# Patient Record
Sex: Female | Born: 1945 | Race: Black or African American | Hispanic: No | Marital: Single | Smoking: Never smoker
Health system: Southern US, Community
[De-identification: ages and names within clinical notes are randomized; demographics above are authoritative.]

## PROBLEM LIST (undated history)

## (undated) DIAGNOSIS — I10 Essential (primary) hypertension: Secondary | ICD-10-CM

## (undated) DIAGNOSIS — K649 Unspecified hemorrhoids: Secondary | ICD-10-CM

## (undated) DIAGNOSIS — H269 Unspecified cataract: Secondary | ICD-10-CM

## (undated) DIAGNOSIS — E669 Obesity, unspecified: Secondary | ICD-10-CM

## (undated) HISTORY — PX: EYE SURGERY: SHX253

## (undated) HISTORY — PX: CATARACT EXTRACTION: SUR2

## (undated) HISTORY — PX: DILATATION AND CURETTAGE/HYSTEROSCOPY WITH MINERVA: SHX6851

## (undated) HISTORY — DX: Essential (primary) hypertension: I10

## (undated) HISTORY — DX: Unspecified hemorrhoids: K64.9

## (undated) HISTORY — DX: Obesity, unspecified: E66.9

## (undated) HISTORY — DX: Unspecified cataract: H26.9

## (undated) HISTORY — PX: TUBAL LIGATION: SHX77

## (undated) HISTORY — PX: BREAST SURGERY: SHX581

---

## 1989-05-05 HISTORY — PX: BREAST EXCISIONAL BIOPSY: SUR124

## 2000-05-14 ENCOUNTER — Encounter (INDEPENDENT_AMBULATORY_CARE_PROVIDER_SITE_OTHER): Payer: Self-pay | Admitting: Specialist

## 2000-05-14 ENCOUNTER — Ambulatory Visit (HOSPITAL_COMMUNITY): Admission: RE | Admit: 2000-05-14 | Discharge: 2000-05-14 | Payer: Self-pay | Admitting: Gastroenterology

## 2006-03-05 ENCOUNTER — Other Ambulatory Visit: Admission: RE | Admit: 2006-03-05 | Discharge: 2006-03-05 | Payer: Self-pay | Admitting: Obstetrics and Gynecology

## 2007-02-26 ENCOUNTER — Ambulatory Visit (HOSPITAL_COMMUNITY): Admission: RE | Admit: 2007-02-26 | Discharge: 2007-02-26 | Payer: Self-pay | Admitting: Surgery

## 2007-03-02 ENCOUNTER — Ambulatory Visit (HOSPITAL_COMMUNITY): Admission: RE | Admit: 2007-03-02 | Discharge: 2007-03-02 | Payer: Self-pay | Admitting: Surgery

## 2007-03-05 ENCOUNTER — Encounter: Admission: RE | Admit: 2007-03-05 | Discharge: 2007-03-05 | Payer: Self-pay | Admitting: Surgery

## 2007-03-11 ENCOUNTER — Encounter: Admission: RE | Admit: 2007-03-11 | Discharge: 2007-03-11 | Payer: Self-pay | Admitting: Surgery

## 2007-05-06 HISTORY — PX: LAPAROSCOPIC GASTRIC BANDING: SHX1100

## 2007-06-14 ENCOUNTER — Encounter: Admission: RE | Admit: 2007-06-14 | Discharge: 2007-09-12 | Payer: Self-pay | Admitting: Surgery

## 2007-06-28 ENCOUNTER — Ambulatory Visit (HOSPITAL_COMMUNITY): Admission: RE | Admit: 2007-06-28 | Discharge: 2007-06-29 | Payer: Self-pay | Admitting: Surgery

## 2010-09-17 NOTE — Op Note (Signed)
NAMECAYLYN, [name]              ACCOUNT NO.:  [id number]   MEDICAL RECORD NO.:  [id number]          PATIENT TYPE:  OIB   LOCATION:  1532                         FACILITY:  [facility]   PHYSICIAN:  Thornton Park. Daphine Deutscher, MD  DATE OF BIRTH:  01-19-1946   DATE OF PROCEDURE:  06/28/2007  DATE OF DISCHARGE:                               OPERATIVE REPORT   PREOPERATIVE DIAGNOSIS:  Morbid obesity, BMI over 46.   POSTOPERATIVE DIAGNOSIS:  Morbid obesity, BMI over 46.   PROCEDURE:  Laparoscopic adjustable gastric band with Allergan lap band  APS system and inspection for hiatal hernia - none seen.   SURGEON:  Thornton Park. Daphine Deutscher, MD   ASSISTANT:  Lorne Skeens. Hoxworth, M.D.   ANESTHESIA:  General endotracheal.   DESCRIPTION OF PROCEDURE:  Gabrielle [name] is a 65 year old African  American lady who desired lap band placement.  She had been to seminars  and instructed and informed consent was obtained regarding this  operation with the possibility of hiatal hernia repair, should that be  necessary.  She was taken to room 1 on June 28, 2007 and given  general anesthesia.  The abdomen was prepped with Techni-Care and draped  sterilely.  I entered the abdomen through the left upper quadrant using  OptiVu 0 degree scope and insufflated without difficulty.  Once  insufflated, we surveyed the abdomen and I placed standard trocars  including a 12 in the right lower position and a 15 in the right upper  position.  I then identified a place on the left sided of the stomach  for the band to come through and then from the lower port inserted the  band passer which came through.  However, prior to doing this we opened  APS kit and inserted the calibration tubing and blew up the balloon to  15 mL of air and pulled it back and it stopped at the EG junction giving  [place] a visible indicated that there was no evidence of a hiatal hernia.   After the band passer was in position, the band was introduced through  the 15 port threaded through the tip and brought around easily.  I then  snapped in place and with calibration tubing, everything appeared to be  in good position.  With it being held down by Dr. Johna Sheriff, I then  placed three free sutures.  I did encounter some hematoma on the second  stitch but with tying down the bleeding seemed to stop and no further  bleeding was noted.  The band appeared to be in good position.  We  brought out the tubing through the lower port and I expanded that  area and created a pocket which I numbed up with lidocaine prior to  suturing it down with four sutures of 2-0 Prolene.  This was sewn to the  fascia.  We irrigated very well and closed the wounds with 4-0 Vicryl  and Dermabond.  The patient tolerated procedure well was taken to  recovery room in satisfactory condition.      Thornton Park Daphine Deutscher, MD  Electronically Signed  MBM/MEDQ  D:  06/28/2007  T:  06/29/2007  Job:  [id number]   cc:   Merlene Laughter. Renae Gloss, M.D.  Fax: [phone]

## 2010-09-20 NOTE — Procedures (Signed)
[place]  Patient:    Gabrielle [name], Gabrielle [name]                       MRN: [id number] Proc. Date: 05/14/00 Adm. Date:  [id number] Attending:  Charna Elizabeth CC:         Merlene Laughter. Renae Gloss, M.D.   Procedure Report  DATE OF BIRTH:  1946/03/31  REFERRING PHYSICIAN:  Merlene Laughter. Renae Gloss, M.D.  PROCEDURE PERFORMED:  Colonoscopy with biopsies.  ENDOSCOPIST:  Anselmo Rod, M.D.  INSTRUMENT USED:  Olympus video colonoscope.  INDICATIONS FOR PROCEDURE:  Family history of colon cancer in a 65 year old African-American female.  Rule out colonic polyps masses, hemorrhoids, etc.  PREPROCEDURE PREPARATION:  Informed consent was procured from the patient. The patient was fasted for eight hours prior to the procedure and prepped with a bottle of magnesium citrate and a gallon of NuLytely the night prior to the procedure.  PREPROCEDURE PHYSICAL:  The patient had stable vital signs.  Neck supple. Chest clear to auscultation.  S1, S2 regular.  Abdomen soft with normal abdominal bowel sounds.  DESCRIPTION OF PROCEDURE:  The patient was placed in the left lateral decubitus position and sedated with 40 mg of Demerol and 4 mg of Versed intravenously.  Once the patient was adequately sedated and maintained on low-flow oxygen and continuous cardiac monitoring, the Olympus video colonoscope was advanced from the rectum to the cecum and terminal ileum without difficulty.  A small flat polypoid lesion was seen in the right colon just distal to the cecum.  This seemed to be lipomatous in nature.  This was biopsied for pathology.  No other abnormalities were noticed. N oclock masses, polyps, erosions, ulcerations or diverticula were present.  There was no evidence of hemorrhoids.  The patient tolerated the procedure well without complications.  IMPRESSION:  Small flat polypoid lesion biopsied from right colon just distal to the cecum.  Question  lipoma.  RECOMMENDATIONS: 1. Await pathology results. 2. Repear colorectal screening in the next five years or earlier if need be. 3. Outpatient follow-up on a p.r.n. basis.DD:  05/14/00 TD:  05/14/00 Job: [id number]

## 2011-01-24 LAB — DIFFERENTIAL
Basophils Absolute: 0
Basophils Relative: 0
Eosinophils Absolute: 0
Eosinophils Relative: 0
Lymphocytes Relative: 10 — ABNORMAL LOW
Lymphs Abs: 1.1
Monocytes Absolute: 0.5
Monocytes Relative: 5
Neutro Abs: 9.2 — ABNORMAL HIGH
Neutrophils Relative %: 85 — ABNORMAL HIGH

## 2011-01-24 LAB — CBC
HCT: 33.1 — ABNORMAL LOW
Hemoglobin: 11 — ABNORMAL LOW
MCHC: 33.3
MCV: 87.7
Platelets: 283
RBC: 3.78 — ABNORMAL LOW
RDW: 15.8 — ABNORMAL HIGH
WBC: 10.9 — ABNORMAL HIGH

## 2011-01-24 LAB — HEMOGLOBIN AND HEMATOCRIT, BLOOD
HCT: 36.9
Hemoglobin: 12.3

## 2011-01-24 LAB — BASIC METABOLIC PANEL
BUN: 17
CO2: 31
Calcium: 9.6
Chloride: 99
Creatinine, Ser: 1
GFR calc Af Amer: >60
GFR calc non Af Amer: 57 — ABNORMAL LOW
Glucose, Bld: 94
Potassium: 4.6
Sodium: 138

## 2011-01-24 LAB — PREGNANCY, URINE: Preg Test, Ur: NEGATIVE

## 2012-11-04 ENCOUNTER — Encounter (INDEPENDENT_AMBULATORY_CARE_PROVIDER_SITE_OTHER): Payer: Self-pay

## 2012-11-08 ENCOUNTER — Encounter (INDEPENDENT_AMBULATORY_CARE_PROVIDER_SITE_OTHER): Payer: Self-pay

## 2012-11-08 ENCOUNTER — Ambulatory Visit (INDEPENDENT_AMBULATORY_CARE_PROVIDER_SITE_OTHER): Payer: BC Managed Care – PPO | Admitting: General Surgery

## 2012-11-08 ENCOUNTER — Encounter (INDEPENDENT_AMBULATORY_CARE_PROVIDER_SITE_OTHER): Payer: Self-pay | Admitting: General Surgery

## 2012-11-08 VITALS — BP 142/84 | HR 60 | Temp 97.3°F | Resp 14 | Ht 66.0 in | Wt 215.8 lb

## 2012-11-08 DIAGNOSIS — K6289 Other specified diseases of anus and rectum: Secondary | ICD-10-CM

## 2012-11-08 NOTE — Patient Instructions (Signed)
Inspect the area monthly as we discussed. If anything changes please call the office and make an appointment to be seen again.

## 2012-11-08 NOTE — Progress Notes (Signed)
Patient ID: Gabrielle [name], female   DOB: 1945-06-12, 67 y.o.   MRN: [id number]  Chief Complaint  Patient presents with  . New Evaluation    eval rectal lesion    HPI Gabrielle [name] is a 67 y.o. female.   HPI  She is referred by Dr. Loreta Ave for further evaluation and treatment of a small anal lesion in the anal verge area. Recently, she had some discomfort and little bleeding in that area. She saw her primary care physician (Dr. Renae Gloss) who then referred her to Dr. Loreta Ave. Colonoscopy was performed and benign adenomas were removed. She states that she's been doing fairly well since the colonoscopy. She denies any pain. She does have some intermittent constipation.  Past Medical History  Diagnosis Date  . Obesity   . Hypertension   . Hemorrhoids     Past Surgical History  Procedure Laterality Date  . Laparoscopic gastric banding  2009  . Tubal ligation    . Breast surgery      Family History  Problem Relation Age of Onset  . COPD Mother     colon  . Heart disease Father   . Heart disease Brother     Social History History  Substance Use Topics  . Smoking status: Never Smoker   . Smokeless tobacco: Never Used  . Alcohol Use: No    No Known Allergies  Current Outpatient Prescriptions  Medication Sig Dispense Refill  . aspirin 81 MG tablet Take 81 mg by mouth daily.      Marland Kitchen lisinopril-hydrochlorothiazide (PRINZIDE,ZESTORETIC) 20-12.5 MG per tablet       . Multiple Vitamins-Minerals (MULTIVITAMIN PO) Take by mouth.      Marland Kitchen azithromycin (ZITHROMAX) 250 MG tablet        No current facility-administered medications for this visit.    Review of Systems Review of Systems  Constitutional:       She has lost 100 pounds since her lap band procedure in 2009.  Gastrointestinal: Positive for constipation and anal bleeding. Negative for abdominal pain.    Blood pressure 142/84, pulse 60, temperature 97.3 F (36.3 C), temperature source Temporal, resp. rate 14, height 5\' 6"  (1.676  m), weight 215 lb 12.8 oz (97.886 kg).  Physical Exam Physical Exam  Constitutional:  Obese female in no acute distress.  Genitourinary:  In the right posterior area as a external hemorrhoid. There is a small smooth nodular area that is palpable consistent with a hypertrophied anal papilla grossly. No further lesions to suggest malignancy. It is nontender.  Anoscopy confirms that there are no other anal masses and only this small smooth nodular area near the anal verge.    Data Reviewed Notes from Dr. Loreta Ave.  Assessment    I think the anal lesion is most consistent with a hypertrophied anal papilla. We discussed options including removing it surgically versus observing it. I told her how to inspect herself. Given those options, she has chosen observation for now.     Plan    If the lesion becomes more bothersome or changes, I have told her it would need to be removed and have instructed her to call the office and get an appointment at that time.        Gabrielle [name] J 11/08/2012, 2:10 PM

## 2012-11-11 ENCOUNTER — Encounter (INDEPENDENT_AMBULATORY_CARE_PROVIDER_SITE_OTHER): Payer: Self-pay

## 2012-12-08 ENCOUNTER — Other Ambulatory Visit: Payer: Self-pay

## 2013-03-10 ENCOUNTER — Other Ambulatory Visit: Payer: Self-pay

## 2016-03-04 ENCOUNTER — Emergency Department (HOSPITAL_BASED_OUTPATIENT_CLINIC_OR_DEPARTMENT_OTHER): Admit: 2016-03-04 | Discharge: 2016-03-04 | Disposition: A | Discharge status: Home / Self Care | Payer: Medicare Other

## 2016-03-04 ENCOUNTER — Emergency Department (HOSPITAL_COMMUNITY)
Admission: EM | Admit: 2016-03-04 | Discharge: 2016-03-04 | Disposition: A | Discharge status: Home / Self Care | Payer: Medicare Other | Attending: Emergency Medicine | Admitting: Emergency Medicine

## 2016-03-04 ENCOUNTER — Encounter (HOSPITAL_COMMUNITY): Payer: Self-pay | Admitting: *Deleted

## 2016-03-04 ENCOUNTER — Encounter (HOSPITAL_COMMUNITY): Payer: Self-pay | Admitting: Emergency Medicine

## 2016-03-04 ENCOUNTER — Ambulatory Visit (HOSPITAL_COMMUNITY)
Admission: EM | Admit: 2016-03-04 | Discharge: 2016-03-04 | Disposition: A | Discharge status: Nursing Home (Includes ICF) | Payer: Medicare Other | Attending: Family Medicine | Admitting: Family Medicine

## 2016-03-04 DIAGNOSIS — M79609 Pain in unspecified limb: Secondary | ICD-10-CM | POA: Diagnosis not present

## 2016-03-04 DIAGNOSIS — R42 Dizziness and giddiness: Secondary | ICD-10-CM | POA: Diagnosis not present

## 2016-03-04 DIAGNOSIS — I10 Essential (primary) hypertension: Secondary | ICD-10-CM | POA: Insufficient documentation

## 2016-03-04 DIAGNOSIS — M79605 Pain in left leg: Secondary | ICD-10-CM

## 2016-03-04 DIAGNOSIS — Z7982 Long term (current) use of aspirin: Secondary | ICD-10-CM | POA: Insufficient documentation

## 2016-03-04 DIAGNOSIS — R202 Paresthesia of skin: Secondary | ICD-10-CM | POA: Insufficient documentation

## 2016-03-04 MED ORDER — NAPROXEN 375 MG PO TABS: 375.0000 mg | ORAL_TABLET | Freq: Two times a day (BID) | ORAL | 0 refills | Status: DC

## 2016-03-04 NOTE — ED Notes (Signed)
Pt transported to venous study.

## 2016-03-04 NOTE — ED Provider Notes (Signed)
[facility]    CSN: [id number] Arrival date & time: 03/04/16  1032     History   Chief Complaint Chief Complaint  Patient presents with  . Leg Pain    HPI Gabrielle [name] is a 70 y.o. female.   The history is provided by the patient.  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   Past Medical History:  Diagnosis Date  . Hemorrhoids   . Hypertension   . Obesity     Patient Active Problem List   Diagnosis Date Noted  . 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 BREAST SURGERY    . LAPAROSCOPIC GASTRIC BANDING  2009  . TUBAL LIGATION      OB History    No data available       Home Medications    Prior to Admission medications   Medication Sig Start Date End Date Taking? Authorizing Provider  Vitamin D, Ergocalciferol, (DRISDOL) 50000 units CAPS capsule Take 50,000 Units by mouth every 7 (seven) days.   Yes Historical Provider, MD  aspirin 81 MG tablet Take 81 mg by mouth daily.    Historical Provider, MD  azithromycin (ZITHROMAX) 250 MG tablet  09/28/12   Historical Provider, MD  lisinopril-hydrochlorothiazide Marcell Anger(PRINZIDE,ZESTORETIC) 20-12.5 MG per tablet  10/08/12   Historical Provider, MD  Multiple Vitamins-Minerals (MULTIVITAMIN PO) Take by mouth.    Historical Provider, MD    Family History Family History  Problem Relation Age of Onset  . COPD Mother     colon  . Heart disease Father   . Heart disease Brother     Social History Social History  Substance Use Topics  . Smoking status: Never Smoker  . Smokeless tobacco: Never  Used  . Alcohol use No     Allergies   Review of patient's allergies indicates no known allergies.   Review of Systems Review of Systems  Constitutional: Negative for fever.  HENT: Negative.   Respiratory: Negative.  Negative for chest tightness and shortness of breath.   Cardiovascular: Negative for chest pain, palpitations and leg swelling.  Musculoskeletal: Negative for back pain.  All other systems reviewed and are negative.    Physical Exam Triage Vital Signs ED Triage Vitals [03/04/16 1056]  Enc Vitals Group     BP 134/78     Pulse Rate 78     Resp      Temp 98.6 F (37 C)     Temp src      SpO2 99 %     Weight      Height      Head Circumference      Peak Flow      Pain Score      Pain Loc      Pain Edu?      Excl. in GC?    No data found.   Updated Vital Signs BP 134/78 (BP Location: Left Arm)   Pulse 78   Temp 98.6 F (37 C)  SpO2 99%   Visual Acuity Right Eye Distance:   Left Eye Distance:   Bilateral Distance:    Right Eye Near:   Left Eye Near:    Bilateral Near:     Physical Exam  Constitutional: She is oriented to person, place, and time. She appears well-developed and well-nourished. No distress.  Abdominal: Soft. Bowel sounds are normal.  Musculoskeletal: She exhibits no edema, tenderness or deformity.  Neurological: She is alert and oriented to person, place, and time.  Skin: Skin is warm and dry.  Nursing note and vitals reviewed.    UC Treatments / Results  Labs (all labs ordered are listed, but only abnormal results are displayed) Labs Reviewed - No data to display  EKG  EKG Interpretation None       Radiology No results found.  Procedures Procedures (including critical care time)  Medications Ordered in UC Medications - No data to display   Initial Impression / Assessment and Plan / UC Course  I have reviewed the triage vital signs and the nursing notes.  Pertinent labs & imaging results that were  available during my care of the patient were reviewed by me and considered in my medical decision making (see chart for details).  Clinical Course    Sent to r/o dvt left leg.  Risk factor prolonged driving episodes to [place]  Final Clinical Impressions(s) / UC Diagnoses   Final diagnoses:  None    New Prescriptions New Prescriptions   No medications on file     Linna HoffJames D Mirella Gueye, MD 03/04/16 1122

## 2016-03-04 NOTE — ED Notes (Signed)
Pt  To  Be  Transferred  To    Er  To  R/o    dvt

## 2016-03-04 NOTE — ED Provider Notes (Signed)
DVT study is negative. Patient discharged home.   Gwyneth SproutWhitney Jamieson Lisa, MD 03/04/16 1620

## 2016-03-04 NOTE — Progress Notes (Signed)
*  PRELIMINARY RESULTS* Vascular Ultrasound Left lower extremity duplex has been completed.  Preliminary findings: No evidence of deep vein thrombosis or baker's cysts in the left lower extremity.   Chauncey FischerCharlotte C Nollie Shiflett 03/04/2016, 3:41 PM

## 2016-03-04 NOTE — ED Provider Notes (Signed)
[facility] Note   CSN: [id number] Arrival date & time: 03/04/16  1124     History   Chief Complaint Chief Complaint  Patient presents with  . Foot Pain    HPI Pola CornWilletta Bottino is a 70 y.o. female.  HPI patient sent from urgent care to ED to rule out DVT. [place] in last couple of days. She reports yesterday she developed numbness and tingling on the outside of her left foot as well as some tightness in her left hamstring. She reports taking Motrin which resolved her discomfort yesterday, but did not repeat that therapy today. She denies any fevers, chills, cough, shortness of breath, unilateral leg swelling or other medical complaints  Past Medical History:  Diagnosis Date  . Hemorrhoids   . Hypertension   . Obesity     Patient Active Problem List   Diagnosis Date Noted  . 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 BREAST SURGERY    . LAPAROSCOPIC GASTRIC BANDING  2009  . TUBAL LIGATION      OB History    No data available       Home Medications    Prior to Admission medications   Medication Sig Start Date End Date Taking? Authorizing Provider  aspirin 81 MG tablet Take 81 mg by mouth daily.   Yes Historical Provider, MD  lisinopril-hydrochlorothiazide (PRINZIDE,ZESTORETIC) 20-12.5 MG per tablet Take 1 tablet by mouth daily.  10/08/12  Yes Historical Provider, MD  Multiple Vitamins-Minerals (MULTIVITAMIN PO) Take 1 tablet by mouth daily.    Yes Historical Provider, MD  Vitamin D, Ergocalciferol, (DRISDOL) 50000 units CAPS capsule Take 50,000 Units by mouth 2 (two) times daily.    Yes Historical Provider, MD    Family History Family History  Problem Relation Age of Onset  . COPD Mother     colon  . Heart disease Father   . Heart disease Brother     Social History Social History  Substance Use Topics  . Smoking status: Never Smoker  . Smokeless  tobacco: Never Used  . Alcohol use No     Allergies   Review of patient's allergies indicates no known allergies.   Review of Systems Review of Systems A 10 point review of systems was completed and was negative except for pertinent positives and negatives as mentioned in the history of present illness    Physical Exam Updated Vital Signs BP 141/75 (BP Location: Left Arm)   Pulse (!) 57   Temp 97.6 F (36.4 C) (Oral)   Resp 14   SpO2 97%   Physical Exam  Constitutional: She appears well-developed. No distress.  Awake, alert and nontoxic in appearance  HENT:  Head: Normocephalic and atraumatic.  Right Ear: External ear normal.  Left Ear: External ear normal.  Mouth/Throat: Oropharynx is clear and moist.  Eyes: Conjunctivae and EOM are normal. Pupils are equal, round, and reactive to light.  Neck: Normal range of motion. No JVD present.  Cardiovascular: Normal rate, regular rhythm and normal heart sounds.   Pulmonary/Chest: Effort normal and breath sounds normal. No stridor.  Abdominal: Soft. There is no tenderness.  Musculoskeletal: Normal range of motion.  No unilateral leg swelling. Left leg is soft with no tenderness along the venous system. Negative Homans. Moves all large joints without difficulty. Distal pulses intact with brisk cap refill. Sensation is intact to light touch along the lateral aspect and throughout  all digits. Gait is baseline without ataxia. No skin abnormalities.  Neurological:  Awake, alert, cooperative and aware of situation; motor strength bilaterally; sensation normal to light touch bilaterally; no facial asymmetry; tongue midline; major cranial nerves appear intact;  baseline gait without new ataxia.  Skin: No rash noted. She is not diaphoretic.  Psychiatric: She has a normal mood and affect. Her behavior is normal. Thought content normal.  Nursing note and vitals reviewed.    ED Treatments / Results  Labs (all labs ordered are listed, but  only abnormal results are displayed) Labs Reviewed - No data to display  EKG  EKG Interpretation None       Radiology No results found.  Procedures Procedures (including critical care time)  Medications Ordered in ED Medications - No data to display   Initial Impression / Assessment and Plan / ED Course  I have reviewed the triage vital signs and the nursing notes.  Pertinent labs & imaging results that were available during my care of the patient were reviewed by me and considered in my medical decision making (see chart for details).  Clinical Course    Patient sent from urgent care to rule out DVT. Pending lower extremity ultrasound. Exam reassuring, hemodynamically stable. Pt care signed out to Dr. Anitra LauthPlunkett. If US negative anticipate discharge with continued Motrin therapy and follow-up with PCP. Return precautions discussed  Final Clinical Impressions(s) / ED Diagnoses   Final diagnoses:  Paresthesia of left foot    New Prescriptions New Prescriptions   No medications on file     Joycie PeekBenjamin Kolter Reaver, PA-C 03/05/16 1144    Pricilla LovelessScott Goldston, MD 03/12/16 [phone]

## 2016-03-04 NOTE — Discharge Instructions (Addendum)
There does not appear to be an emergent cause for your symptoms at this time. Your exam, ultrasound were very reassuring. Please follow-up with your doctor next week. Continue taking Motrin as we discussed. Return to ED for new or worsening symptoms as we discussed.

## 2016-03-04 NOTE — ED Triage Notes (Signed)
Pt states "yesterday morning I had numbness in my left toes, thought it was maybe a cramp, and now my whole foot is numb and cold, used a heating pad on it and it did help some, and I feel like theres some stretching in the back of my left thigh." Pt sensation is intact, strength equal. Pt ambulatory, no facial droop, no chest pain, stroke screen negative.

## 2016-03-04 NOTE — ED Triage Notes (Addendum)
Pain  And  Numbness  l      l  Leg  With  Tightness         denys   Any   Recent     Injury  No  Recent  Air  Travel          denys  Any  Chest  Pain or  Shortness  Of  Breath  Pt  Reports  Some   Cramping  l   Upper  Thigh  As   Well

## 2017-01-14 ENCOUNTER — Other Ambulatory Visit: Payer: Self-pay | Admitting: Internal Medicine

## 2017-01-14 DIAGNOSIS — N63 Unspecified lump in unspecified breast: Secondary | ICD-10-CM

## 2017-01-21 ENCOUNTER — Ambulatory Visit
Admission: RE | Admit: 2017-01-21 | Discharge: 2017-01-21 | Disposition: A | Discharge status: Home / Self Care | Payer: Medicare Other | Source: Ambulatory Visit | Attending: Internal Medicine | Admitting: Internal Medicine

## 2017-01-21 ENCOUNTER — Ambulatory Visit: Payer: Medicare Other

## 2017-01-21 DIAGNOSIS — N63 Unspecified lump in unspecified breast: Secondary | ICD-10-CM

## 2017-03-25 ENCOUNTER — Other Ambulatory Visit: Payer: Self-pay | Admitting: Internal Medicine

## 2017-03-25 DIAGNOSIS — E2839 Other primary ovarian failure: Secondary | ICD-10-CM

## 2018-02-16 ENCOUNTER — Telehealth: Payer: Self-pay | Admitting: General Practice

## 2018-02-16 NOTE — Telephone Encounter (Signed)
I left a message letting the patient know that her yearly Medicare questionnaire has been scheduled on 02/3018 with [place]ke[place]. VDM (DD)

## 2018-03-03 ENCOUNTER — Ambulatory Visit: Payer: Medicare Other

## 2018-03-03 DIAGNOSIS — Z23 Encounter for immunization: Secondary | ICD-10-CM

## 2018-03-03 NOTE — Progress Notes (Signed)
Subjective:   Ophia Lechuga is a 72 y.o. female who presents for Medicare Annual (Subsequent) preventive examination.  Review of Systems:  n/a Cardiac Risk Factors include: advanced age (>110men, >71 women);hypertension;obesity (BMI >30kg/m2)     Objective:     Vitals: BP 130/72 (BP Location: Left Arm)   Pulse (!) 56   Temp 97.9 F (36.6 C)   Ht 5\' 5"  (1.651 m)   Wt 230 lb 3.2 oz (104.4 kg)   BMI 38.31 kg/m   Body mass index is 38.31 kg/m.  Advanced Directives 03/03/2018  Does Patient Have a Medical Advance Directive? Yes  Type of Advance Directive Living will  Does patient want to make changes to medical advance directive? No - Patient declined    Tobacco Social History   Tobacco Use  Smoking Status Never Smoker  Smokeless Tobacco Never Used     Counseling given: Not Answered   Clinical Intake:  Pre-visit preparation completed: Yes  Pain : No/denies pain Pain Score: 0-No pain     Nutritional Status: BMI > 30  Obese Nutritional Risks: None Diabetes: No  How often do you need to have someone help you when you read instructions, pamphlets, or other written materials from your doctor or pharmacy?: 1 - Never What is the last grade level you completed in school?: SOME COLLEGE  Interpreter Needed?: No  Information entered by :: NAllen LPN  Past Medical History:  Diagnosis Date  . Hemorrhoids   . Hypertension   . Obesity    Past Surgical History:  Procedure Laterality Date  . BREAST EXCISIONAL BIOPSY Left 1991  . BREAST SURGERY    . LAPAROSCOPIC GASTRIC BANDING  2009  . TUBAL LIGATION     Family History  Problem Relation Age of Onset  . COPD Mother        colon  . Heart disease Father   . Heart disease Brother    Social History   Socioeconomic History  . Marital status: Single    Spouse name: Not on file  . Number of children: Not on file  . Years of education: Not on file  . Highest education level: Not on file  Occupational History    . Occupation: retired  Engineer, production  . Financial resource strain: Not hard at all  . Food insecurity:    Worry: Never true    Inability: Never true  . Transportation needs:    Medical: No    Non-medical: No  Tobacco Use  . Smoking status: Never Smoker  . Smokeless tobacco: Never Used  Substance and Sexual Activity  . Alcohol use: No  . Drug use: No  . Sexual activity: Not Currently  Lifestyle  . Physical activity:    Days per week: 4 days    Minutes per session: 60 min  . Stress: Not at all  Relationships  . Social connections:    Talks on phone: Not on file    Gets together: Not on file    Attends religious service: Not on file    Active member of club or organization: Not on file    Attends meetings of clubs or organizations: Not on file    Relationship status: Not on file  Other Topics Concern  . Not on file  Social History Narrative  . Not on file    Outpatient Encounter Medications as of 03/03/2018  Medication Sig  . aspirin 81 MG tablet Take 81 mg by mouth daily.  Marland Kitchen lisinopril-hydrochlorothiazide (PRINZIDE,ZESTORETIC) 20-12.5  MG per tablet Take 1 tablet by mouth daily.   . Multiple Vitamins-Minerals (MULTIVITAMIN PO) Take 1 tablet by mouth daily.   . Vitamin D, Ergocalciferol, (DRISDOL) 50000 units CAPS capsule Take 50,000 Units by mouth 2 (two) times daily.   . naproxen (NAPROSYN) 375 MG tablet Take 1 tablet (375 mg total) by mouth 2 (two) times daily.   No facility-administered encounter medications on file as of 03/03/2018.     Activities of Daily Living In your present state of health, do you have any difficulty performing the following activities: 03/03/2018  Hearing? N  Vision? Y  Comment sight has changed, but has annual exams. Small cataract being watched  Difficulty concentrating or making decisions? N  Walking or climbing stairs? N  Dressing or bathing? N  Doing errands, shopping? N  Preparing Food and eating ? N  Using the Toilet? N  In the  past six months, have you accidently leaked urine? N  Do you have problems with loss of bowel control? N  Managing your Medications? N  Managing your Finances? N  Housekeeping or managing your Housekeeping? N  Some recent data might be hidden    Patient Care Team: Dorothyann Peng, MD as PCP - General (Internal Medicine) Sallye Lat, MD as Consulting Physician (Ophthalmology)    Assessment:   This is a routine wellness examination for [place].  Exercise Activities and Dietary recommendations Current Exercise Habits: Structured exercise class, Type of exercise: strength training/weights;calisthenics, Time (Minutes): 60, Frequency (Times/Week): 4, Weekly Exercise (Minutes/Week): 240, Intensity: Moderate, Exercise limited by: None identified  Goals    . Weight (lb) < 200 lb (90.7 kg) (pt-stated)     Would like to lose 8 pounds gained over last year       Fall Risk Fall Risk  03/03/2018  Falls in the past year? No  Risk for fall due to : Medication side effect   Is the patient's home free of loose throw rugs in walkways, pet beds, electrical cords, etc?   yes      Grab bars in the bathroom? no      Handrails on the stairs?   yes      Adequate lighting?   yes  Timed Get Up and Go performed: n/a  Depression Screen PHQ 2/9 Scores 03/03/2018  PHQ - 2 Score 0     Cognitive Function     6CIT Screen 03/03/2018  What Year? 0 points  What month? 0 points  What time? 0 points  Count back from 20 0 points  Months in reverse 0 points  Repeat phrase 0 points  Total Score 0    Immunization History  Administered Date(s) Administered  . Influenza, High Dose Seasonal PF 03/03/2018    Qualifies for Shingles Vaccine?yes  Screening Tests Health Maintenance  Topic Date Due  . Hepatitis C Screening  [birth year]  . PNA vac Low Risk Adult (1 of 2 - PCV13) 09/17/2012  . MAMMOGRAM  01/22/2019  . COLONOSCOPY  11/02/2022  . TETANUS/TDAP  01/11/2024  . INFLUENZA VACCINE   Completed  . DEXA SCAN  Completed    Cancer Screenings: Lung: Low Dose CT Chest recommended if Age 56-80 years, 30 pack-year currently smoking OR have quit w/in 15years. Patient does not qualify. Breast:  Up to date on Mammogram? No   Up to date of Bone Density/Dexa? No Colorectal: up to date  Additional Screenings: : Hepatitis C Screening: n/a     Plan:    Patient wants to  lose 8 pounds that she gained in the past year. States that she has a mammogram appt made and that she will look in to BMD at that visit.  I have personally reviewed and noted the following in the patient's chart:  . Medical and social history . Use of alcohol, tobacco or illicit drugs  . Current medications and supplements . Functional ability and status . Nutritional status . Physical activity . Advanced directives . List of other physicians . Hospitalizations, surgeries, and ER visits in previous 12 months . Vitals . Screenings to include cognitive, depression, and falls . Referrals and appointments  In addition, I have reviewed and discussed with patient certain preventive protocols, quality metrics, and best practice recommendations. A written personalized care plan for preventive services as well as general preventive health recommendations were provided to patient.     Barb Merino, LPN  [id number]/21/[id number]

## 2018-03-03 NOTE — Patient Instructions (Signed)
Gabrielle [name] , Thank you for taking time to come for your Medicare Wellness Visit. I appreciate your ongoing commitment to your health goals. Please review the following plan we discussed and let me know if I can assist you in the future.   Screening recommendations/referrals: Colonoscopy: 10/2012 Mammogram: appt. set Bone Density: pt. Will look in to Recommended yearly ophthalmology/optometry visit for glaucoma screening and checkup Recommended yearly dental visit for hygiene and checkup  Vaccinations: Influenza vaccine: today Pneumococcal vaccine: prevnar 13 order sent to pharmacy Tdap vaccine: 01/2014 Shingles vaccine:   Advanced directives: Please bring a copy of your POA (Power of [place]) and/or Living Will to your next appointment.    Conditions/risks identified: Obesity: Patient wants to lose 8 pounds gained over last year  Next appointment: 04/14/2018 at 9:15a   Preventive Care 70 Years and Older, Female Preventive care refers to lifestyle choices and visits with your health care provider that can promote health and wellness. What does preventive care include?  A yearly physical exam. This is also called an annual well check.  Dental exams once or twice a year.  Routine eye exams. Ask your health care provider how often you should have your eyes checked.  Personal lifestyle choices, including:  Daily care of your teeth and gums.  Regular physical activity.  Eating a healthy diet.  Avoiding tobacco and drug use.  Limiting alcohol use.  Practicing safe sex.  Taking low-dose aspirin every day.  Taking vitamin and mineral supplements as recommended by your health care provider. What happens during an annual well check? The services and screenings done by your health care provider during your annual well check will depend on your age, overall health, lifestyle risk factors, and family history of disease. Counseling  Your health care provider may ask you questions  about your:  Alcohol use.  Tobacco use.  Drug use.  Emotional well-being.  Home and relationship well-being.  Sexual activity.  Eating habits.  History of falls.  Memory and ability to understand (cognition).  Work and work Astronomer.  Reproductive health. Screening  You may have the following tests or measurements:  Height, weight, and BMI.  Blood pressure.  Lipid and cholesterol levels. These may be checked every 5 years, or more frequently if you are over 70 years old.  Skin check.  Lung cancer screening. You may have this screening every year starting at age 70 if you have a 30-pack-year history of smoking and currently smoke or have quit within the past 15 years.  Fecal occult blood test (FOBT) of the stool. You may have this test every year starting at age 70.  Flexible sigmoidoscopy or colonoscopy. You may have a sigmoidoscopy every 5 years or a colonoscopy every 10 years starting at age 70.  Hepatitis C blood test.  Hepatitis B blood test.  Sexually transmitted disease (STD) testing.  Diabetes screening. This is done by checking your blood sugar (glucose) after you have not eaten for a while (fasting). You may have this done every 1-3 years.  Bone density scan. This is done to screen for osteoporosis. You may have this done starting at age 70.  Mammogram. This may be done every 1-2 years. Talk to your health care provider about how often you should have regular mammograms. Talk with your health care provider about your test results, treatment options, and if necessary, the need for more tests. Vaccines  Your health care provider may recommend certain vaccines, such as:  Influenza vaccine. This is  recommended every year.  Tetanus, diphtheria, and acellular pertussis (Tdap, Td) vaccine. You may need a Td booster every 10 years.  Zoster vaccine. You may need this after age 70.  Pneumococcal 13-valent conjugate (PCV13) vaccine. One dose is  recommended after age 70.  Pneumococcal polysaccharide (PPSV23) vaccine. One dose is recommended after age 70. Talk to your health care provider about which screenings and vaccines you need and how often you need them. This information is not intended to replace advice given to you by your health care provider. Make sure you discuss any questions you have with your health care provider. Document Released: 05/18/2015 Document Revised: 01/09/2016 Document Reviewed: 02/20/2015 Elsevier Interactive Patient Education  2017 [place] Prevention in the Home Falls can cause injuries. They can happen to people of all ages. There are many things you can do to make your home safe and to help prevent falls. What can I do on the outside of my home?  Regularly fix the edges of walkways and driveways and fix any cracks.  Remove anything that might make you trip as you walk through a door, such as a raised step or threshold.  Trim any bushes or trees on the path to your home.  Use bright outdoor lighting.  Clear any walking paths of anything that might make someone trip, such as rocks or tools.  Regularly check to see if handrails are loose or broken. Make sure that both sides of any steps have handrails.  Any raised decks and porches should have guardrails on the edges.  Have any leaves, snow, or ice cleared regularly.  Use sand or salt on walking paths during winter.  Clean up any spills in your garage right away. This includes oil or grease spills. What can I do in the bathroom?  Use night lights.  Install grab bars by the toilet and in the tub and shower. Do not use towel bars as grab bars.  Use non-skid mats or decals in the tub or shower.  If you need to sit down in the shower, use a plastic, non-slip stool.  Keep the floor dry. Clean up any water that spills on the floor as soon as it happens.  Remove soap buildup in the tub or shower regularly.  Attach bath mats  securely with double-sided non-slip rug tape.  Do not have throw rugs and other things on the floor that can make you trip. What can I do in the bedroom?  Use night lights.  Make sure that you have a light by your bed that is easy to reach.  Do not use any sheets or blankets that are too big for your bed. They should not hang down onto the floor.  Have a firm chair that has side arms. You can use this for support while you get dressed.  Do not have throw rugs and other things on the floor that can make you trip. What can I do in the kitchen?  Clean up any spills right away.  Avoid walking on wet floors.  Keep items that you use a lot in easy-to-reach places.  If you need to reach something above you, use a strong step stool that has a grab bar.  Keep electrical cords out of the way.  Do not use floor polish or wax that makes floors slippery. If you must use wax, use non-skid floor wax.  Do not have throw rugs and other things on the floor that can make you trip.  What can I do with my stairs?  Do not leave any items on the stairs.  Make sure that there are handrails on both sides of the stairs and use them. Fix handrails that are broken or loose. Make sure that handrails are as long as the stairways.  Check any carpeting to make sure that it is firmly attached to the stairs. Fix any carpet that is loose or worn.  Avoid having throw rugs at the top or bottom of the stairs. If you do have throw rugs, attach them to the floor with carpet tape.  Make sure that you have a light switch at the top of the stairs and the bottom of the stairs. If you do not have them, ask someone to add them for you. What else can I do to help prevent falls?  Wear shoes that:  Do not have high heels.  Have rubber bottoms.  Are comfortable and fit you well.  Are closed at the toe. Do not wear sandals.  If you use a stepladder:  Make sure that it is fully opened. Do not climb a closed  stepladder.  Make sure that both sides of the stepladder are locked into place.  Ask someone to hold it for you, if possible.  Clearly mark and make sure that you can see:  Any grab bars or handrails.  First and last steps.  Where the edge of each step is.  Use tools that help you move around (mobility aids) if they are needed. These include:  Canes.  Walkers.  Scooters.  Crutches.  Turn on the lights when you go into a dark area. Replace any light bulbs as soon as they burn out.  Set up your furniture so you have a clear path. Avoid moving your furniture around.  If any of your floors are uneven, fix them.  If there are any pets around you, be aware of where they are.  Review your medicines with your doctor. Some medicines can make you feel dizzy. This can increase your chance of falling. Ask your doctor what other things that you can do to help prevent falls. This information is not intended to replace advice given to you by your health care provider. Make sure you discuss any questions you have with your health care provider. Document Released: 02/15/2009 Document Revised: 09/27/2015 Document Reviewed: 05/26/2014 Elsevier Interactive Patient Education  2017 [employer].

## 2018-04-14 ENCOUNTER — Ambulatory Visit: Payer: Self-pay | Admitting: Internal Medicine

## 2018-07-02 ENCOUNTER — Other Ambulatory Visit: Payer: Self-pay | Admitting: Internal Medicine

## 2018-07-06 ENCOUNTER — Encounter: Payer: Self-pay | Admitting: Internal Medicine

## 2018-07-06 ENCOUNTER — Ambulatory Visit: Payer: Medicare Other | Admitting: Internal Medicine

## 2018-07-06 VITALS — BP 120/84 | HR 70 | Ht 65.0 in | Wt 233.4 lb

## 2018-07-06 DIAGNOSIS — R7309 Other abnormal glucose: Secondary | ICD-10-CM

## 2018-07-06 DIAGNOSIS — I129 Hypertensive chronic kidney disease with stage 1 through stage 4 chronic kidney disease, or unspecified chronic kidney disease: Secondary | ICD-10-CM | POA: Diagnosis not present

## 2018-07-06 DIAGNOSIS — D649 Anemia, unspecified: Secondary | ICD-10-CM

## 2018-07-06 DIAGNOSIS — Z23 Encounter for immunization: Secondary | ICD-10-CM

## 2018-07-06 DIAGNOSIS — E66812 Obesity, class 2: Secondary | ICD-10-CM | POA: Insufficient documentation

## 2018-07-06 DIAGNOSIS — N182 Chronic kidney disease, stage 2 (mild): Secondary | ICD-10-CM | POA: Diagnosis not present

## 2018-07-06 DIAGNOSIS — Z760 Encounter for issue of repeat prescription: Secondary | ICD-10-CM | POA: Diagnosis not present

## 2018-07-06 DIAGNOSIS — Z6838 Body mass index (BMI) 38.0-38.9, adult: Secondary | ICD-10-CM

## 2018-07-06 MED ORDER — PNEUMOCOCCAL 13-VAL CONJ VACC IM SUSP: 0.5000 mL | INTRAMUSCULAR | 0 refills | Status: AC

## 2018-07-06 MED ORDER — LISINOPRIL-HYDROCHLOROTHIAZIDE 20-12.5 MG PO TABS: 1.0000 | ORAL_TABLET | Freq: Every day | ORAL | 2 refills | Status: DC

## 2018-07-06 MED ORDER — PNEUMOCOCCAL 13-VAL CONJ VACC IM SUSP: 0.5000 mL | INTRAMUSCULAR | 0 refills | Status: DC

## 2018-07-06 NOTE — Progress Notes (Signed)
Subjective:     Patient ID: Gabrielle [name] , female    DOB: [birth year] , 73 y.o.   MRN: [id number]   Chief Complaint  Patient presents with  . med check    need refill on htn medication ,no other concerns   . Hypertension    HPI  She is here today for refill of her medications. She has no other concerns at this time.   Hypertension  This is a chronic problem. The current episode started more than 1 year ago. The problem has been gradually improving since onset. The problem is controlled. Pertinent negatives include no blurred vision, chest pain, palpitations or shortness of breath.     Past Medical History:  Diagnosis Date  . Hemorrhoids   . Hypertension   . Obesity      Family History  Problem Relation Age of Onset  . COPD Mother        colon  . Heart disease Father   . Heart disease Brother      Current Outpatient Medications:  .  aspirin 81 MG tablet, Take 81 mg by mouth daily., Disp: , Rfl:  .  lisinopril-hydrochlorothiazide (PRINZIDE,ZESTORETIC) 20-12.5 MG per tablet, Take 1 tablet by mouth daily. , Disp: , Rfl:  .  Multiple Vitamins-Minerals (MULTIVITAMIN PO), Take 1 tablet by mouth daily. , Disp: , Rfl:  .  Vitamin D, Ergocalciferol, (DRISDOL) 50000 units CAPS capsule, Take 50,000 Units by mouth 2 (two) times daily. , Disp: , Rfl:  .  pneumococcal 13-valent conjugate vaccine (PREVNAR 13) SUSP injection, Inject 0.5 mLs into the muscle tomorrow at 10 am for 1 dose., Disp: 0.5 mL, Rfl: 0   No Known Allergies   Review of Systems  Constitutional: Negative.   Eyes: Negative for blurred vision.  Respiratory: Negative.  Negative for shortness of breath.   Cardiovascular: Negative.  Negative for chest pain and palpitations.  Gastrointestinal: Negative.   Neurological: Negative.   Psychiatric/Behavioral: Negative.      Today's Vitals   07/06/18 0927  BP: 120/84  Pulse: 70  SpO2: 97%  Weight: 233 lb 6.4 oz (105.9 kg)  Height: '5\' 5"'  (1.651 m)   Body mass  index is 38.84 kg/m.   Objective:  Physical Exam Vitals signs and nursing note reviewed.  Constitutional:      Appearance: Normal appearance.  HENT:     Head: Normocephalic and atraumatic.  Cardiovascular:     Rate and Rhythm: Normal rate and regular rhythm.     Heart sounds: Normal heart sounds.  Pulmonary:     Effort: Pulmonary effort is normal.     Breath sounds: Normal breath sounds.  Skin:    General: Skin is warm.  Neurological:     General: No focal deficit present.     Mental Status: She is alert.  Psychiatric:        Mood and Affect: Mood normal.        Behavior: Behavior normal.         Assessment And Plan:     1. Hypertensive nephropathy  Well controlled. She will continue with current meds. Refill sent to pharmacy. She will rto in six months for a physical exam and AWV.   - Lipid panel - CMP14+EGFR  2. Chronic renal disease, stage II  Chronic. I will check GFR, Cr today.   3. Other abnormal glucose  HER A1C HAS BEEN ELEVATED IN THE PAST. I WILL CHECK AN A1C, BMET TODAY. SHE WAS ENCOURAGED TO AVOID SUGARY  BEVERAGES AND PROCESSED FOODS INCLUDNG BREADS, RICE AND PASTA.  - Hemoglobin A1c  4. Anemia, unspecified type  I will check blood count today. I will add on iron studies as needed.   - CBC no Diff  5. Class 2 severe obesity due to excess calories with serious comorbidity and body mass index (BMI) of 38.0 to 38.9 in adult [facility])  Importance of achieving optimal weight to decrease risk of cardiovascular disease and cancers was discussed with the patient in full detail. She is encouraged to start slowly - start with 10 minutes twice daily at least three to four days per week and to gradually build to 30 minutes five days weekly. She was given tips to incorporate more activity into her daily routine - take stairs when possible, park farther away from her job, grocery stores, etc.   6. Need for vaccination  Rx Prevnar-13 was sent to the pharmacy.    Maximino Greenland, MD

## 2018-07-06 NOTE — Patient Instructions (Signed)
Pneumococcal Conjugate Vaccine suspension for injection  What is this medicine?  PNEUMOCOCCAL VACCINE (NEU mo KOK al vak SEEN) is a vaccine used to prevent pneumococcus bacterial infections. These bacteria can cause serious infections like pneumonia, meningitis, and blood infections. This vaccine will lower your chance of getting pneumonia. If you do get pneumonia, it can make your symptoms milder and your illness shorter. This vaccine will not treat an infection and will not cause infection. This vaccine is recommended for infants and young children, adults with certain medical conditions, and adults 65 years or older.  This medicine may be used for other purposes; ask your health care provider or pharmacist if you have questions.  COMMON BRAND NAME(S): Prevnar, Prevnar 13  What should I tell my health care provider before I take this medicine?  They need to know if you have any of these conditions:  -bleeding problems  -fever  -immune system problems  -an unusual or allergic reaction to pneumococcal vaccine, diphtheria toxoid, other vaccines, latex, other medicines, foods, dyes, or preservatives  -pregnant or trying to get pregnant  -breast-feeding  How should I use this medicine?  This vaccine is for injection into a muscle. It is given by a health care professional.  A copy of Vaccine Information Statements will be given before each vaccination. Read this sheet carefully each time. The sheet may change frequently.  Talk to your pediatrician regarding the use of this medicine in children. While this drug may be prescribed for children as young as 6 weeks old for selected conditions, precautions do apply.  Overdosage: If you think you have taken too much of this medicine contact a poison control center or emergency room at once.  NOTE: This medicine is only for you. Do not share this medicine with others.  What if I miss a dose?  It is important not to miss your dose. Call your doctor or health care professional  if you are unable to keep an appointment.  What may interact with this medicine?  -medicines for cancer chemotherapy  -medicines that suppress your immune function  -steroid medicines like prednisone or cortisone  This list may not describe all possible interactions. Give your health care provider a list of all the medicines, herbs, non-prescription drugs, or dietary supplements you use. Also tell them if you smoke, drink alcohol, or use illegal drugs. Some items may interact with your medicine.  What should I watch for while using this medicine?  Mild fever and pain should go away in 3 days or less. Report any unusual symptoms to your doctor or health care professional.  What side effects may I notice from receiving this medicine?  Side effects that you should report to your doctor or health care professional as soon as possible:  -allergic reactions like skin rash, itching or hives, swelling of the face, lips, or tongue  -breathing problems  -confused  -fast or irregular heartbeat  -fever over 102 degrees F  -seizures  -unusual bleeding or bruising  -unusual muscle weakness  Side effects that usually do not require medical attention (report to your doctor or health care professional if they continue or are bothersome):  -aches and pains  -diarrhea  -fever of 102 degrees F or less  -headache  -irritable  -loss of appetite  -pain, tender at site where injected  -trouble sleeping  This list may not describe all possible side effects. Call your doctor for medical advice about side effects. You may report side effects to FDA   at 1-800-FDA-1088.  Where should I keep my medicine?  This does not apply. This vaccine is given in a clinic, pharmacy, doctor's office, or other health care setting and will not be stored at home.  NOTE: This sheet is a summary. It may not cover all possible information. If you have questions about this medicine, talk to your doctor, pharmacist, or health care provider.   2019 Elsevier/Gold  Standard (2014-01-26 10:27:27)

## 2018-07-07 LAB — LIPID PANEL
Chol/HDL Ratio: 4.4 ratio (ref 0.0–4.4)
Cholesterol, Total: 336 mg/dL — ABNORMAL HIGH (ref 100–199)
HDL: 77 mg/dL
LDL Calculated: 243 mg/dL — ABNORMAL HIGH (ref 0–99)
Triglycerides: 79 mg/dL (ref 0–149)
VLDL Cholesterol Cal: 16 mg/dL (ref 5–40)

## 2018-07-07 LAB — CMP14+EGFR
ALT: 6 IU/L (ref 0–32)
AST: 11 IU/L (ref 0–40)
Albumin/Globulin Ratio: 1.8 (ref 1.2–2.2)
Albumin: 4.2 g/dL (ref 3.8–4.8)
Alkaline Phosphatase: 67 IU/L (ref 39–117)
BUN/Creatinine Ratio: 15 (ref 12–28)
BUN: 17 mg/dL (ref 8–27)
Bilirubin Total: 0.4 mg/dL (ref 0.0–1.2)
CO2: 28 mmol/L (ref 20–29)
Calcium: 9.6 mg/dL (ref 8.7–10.3)
Chloride: 101 mmol/L (ref 96–106)
Creatinine, Ser: 1.13 mg/dL — ABNORMAL HIGH (ref 0.57–1.00)
GFR calc Af Amer: 57 mL/min/1.73 — ABNORMAL LOW
GFR calc non Af Amer: 49 mL/min/1.73 — ABNORMAL LOW
Globulin, Total: 2.4 g/dL (ref 1.5–4.5)
Glucose: 93 mg/dL (ref 65–99)
Potassium: 5.2 mmol/L (ref 3.5–5.2)
Sodium: 142 mmol/L (ref 134–144)
Total Protein: 6.6 g/dL (ref 6.0–8.5)

## 2018-07-07 LAB — HEMOGLOBIN A1C
Est. average glucose Bld gHb Est-mCnc: 120 mg/dL
Hgb A1c MFr Bld: 5.8 % — ABNORMAL HIGH (ref 4.8–5.6)

## 2018-11-04 ENCOUNTER — Telehealth: Payer: Self-pay | Admitting: Internal Medicine

## 2018-11-04 NOTE — Telephone Encounter (Signed)
I called the patient to schedule her AWV with Nickeah.  She requested to have it virtually instead of in the office. VDM (DD)

## 2018-11-09 ENCOUNTER — Other Ambulatory Visit: Payer: Self-pay

## 2018-11-09 ENCOUNTER — Ambulatory Visit (INDEPENDENT_AMBULATORY_CARE_PROVIDER_SITE_OTHER): Payer: Medicare Other

## 2018-11-09 VITALS — Ht 65.0 in | Wt 220.0 lb

## 2018-11-09 DIAGNOSIS — Z0000 Encounter for general adult medical examination without abnormal findings: Secondary | ICD-10-CM

## 2018-11-09 NOTE — Patient Instructions (Signed)
Gabrielle [name] , Thank you for taking time to come for your Medicare Wellness Visit. I appreciate your ongoing commitment to your health goals. Please review the following plan we discussed and let me know if I can assist you in the future.   Screening recommendations/referrals: Colonoscopy: 10/2012 Mammogram: 07/2018 per patient Bone Density: 02/2007 Recommended yearly ophthalmology/optometry visit for glaucoma screening and checkup Recommended yearly dental visit for hygiene and checkup  Vaccinations: Influenza vaccine: 02/2018 Pneumococcal vaccine: 07/2018 Tdap vaccine: 10/2009 Shingles vaccine: discussed    Advanced directives: Please bring a copy of your POA (Power of [place]) and/or Living Will to your next appointment.    Conditions/risks identified: obesity  Next appointment:    Preventive Care 51 Years and Older, Female Preventive care refers to lifestyle choices and visits with your health care provider that can promote health and wellness. What does preventive care include?  A yearly physical exam. This is also called an annual well check.  Dental exams once or twice a year.  Routine eye exams. Ask your health care provider how often you should have your eyes checked.  Personal lifestyle choices, including:  Daily care of your teeth and gums.  Regular physical activity.  Eating a healthy diet.  Avoiding tobacco and drug use.  Limiting alcohol use.  Practicing safe sex.  Taking low-dose aspirin every day.  Taking vitamin and mineral supplements as recommended by your health care provider. What happens during an annual well check? The services and screenings done by your health care provider during your annual well check will depend on your age, overall health, lifestyle risk factors, and family history of disease. Counseling  Your health care provider may ask you questions about your:  Alcohol use.  Tobacco use.  Drug use.  Emotional well-being.  Home  and relationship well-being.  Sexual activity.  Eating habits.  History of falls.  Memory and ability to understand (cognition).  Work and work Statistician.  Reproductive health. Screening  You may have the following tests or measurements:  Height, weight, and BMI.  Blood pressure.  Lipid and cholesterol levels. These may be checked every 5 years, or more frequently if you are over 79 years old.  Skin check.  Lung cancer screening. You may have this screening every year starting at age 75 if you have a 30-pack-year history of smoking and currently smoke or have quit within the past 15 years.  Fecal occult blood test (FOBT) of the stool. You may have this test every year starting at age 44.  Flexible sigmoidoscopy or colonoscopy. You may have a sigmoidoscopy every 5 years or a colonoscopy every 10 years starting at age 66.  Hepatitis C blood test.  Hepatitis B blood test.  Sexually transmitted disease (STD) testing.  Diabetes screening. This is done by checking your blood sugar (glucose) after you have not eaten for a while (fasting). You may have this done every 1-3 years.  Bone density scan. This is done to screen for osteoporosis. You may have this done starting at age 20.  Mammogram. This may be done every 1-2 years. Talk to your health care provider about how often you should have regular mammograms. Talk with your health care provider about your test results, treatment options, and if necessary, the need for more tests. Vaccines  Your health care provider may recommend certain vaccines, such as:  Influenza vaccine. This is recommended every year.  Tetanus, diphtheria, and acellular pertussis (Tdap, Td) vaccine. You may need a Td booster  every 10 years.  Zoster vaccine. You may need this after age 37.  Pneumococcal 13-valent conjugate (PCV13) vaccine. One dose is recommended after age 36.  Pneumococcal polysaccharide (PPSV23) vaccine. One dose is recommended  after age 53. Talk to your health care provider about which screenings and vaccines you need and how often you need them. This information is not intended to replace advice given to you by your health care provider. Make sure you discuss any questions you have with your health care provider. Document Released: 05/18/2015 Document Revised: 01/09/2016 Document Reviewed: 02/20/2015 Elsevier Interactive Patient Education  2017 [place] Prevention in the Home Falls can cause injuries. They can happen to people of all ages. There are many things you can do to make your home safe and to help prevent falls. What can I do on the outside of my home?  Regularly fix the edges of walkways and driveways and fix any cracks.  Remove anything that might make you trip as you walk through a door, such as a raised step or threshold.  Trim any bushes or trees on the path to your home.  Use bright outdoor lighting.  Clear any walking paths of anything that might make someone trip, such as rocks or tools.  Regularly check to see if handrails are loose or broken. Make sure that both sides of any steps have handrails.  Any raised decks and porches should have guardrails on the edges.  Have any leaves, snow, or ice cleared regularly.  Use sand or salt on walking paths during winter.  Clean up any spills in your garage right away. This includes oil or grease spills. What can I do in the bathroom?  Use night lights.  Install grab bars by the toilet and in the tub and shower. Do not use towel bars as grab bars.  Use non-skid mats or decals in the tub or shower.  If you need to sit down in the shower, use a plastic, non-slip stool.  Keep the floor dry. Clean up any water that spills on the floor as soon as it happens.  Remove soap buildup in the tub or shower regularly.  Attach bath mats securely with double-sided non-slip rug tape.  Do not have throw rugs and other things on the floor  that can make you trip. What can I do in the bedroom?  Use night lights.  Make sure that you have a light by your bed that is easy to reach.  Do not use any sheets or blankets that are too big for your bed. They should not hang down onto the floor.  Have a firm chair that has side arms. You can use this for support while you get dressed.  Do not have throw rugs and other things on the floor that can make you trip. What can I do in the kitchen?  Clean up any spills right away.  Avoid walking on wet floors.  Keep items that you use a lot in easy-to-reach places.  If you need to reach something above you, use a strong step stool that has a grab bar.  Keep electrical cords out of the way.  Do not use floor polish or wax that makes floors slippery. If you must use wax, use non-skid floor wax.  Do not have throw rugs and other things on the floor that can make you trip. What can I do with my stairs?  Do not leave any items on the stairs.  Make  sure that there are handrails on both sides of the stairs and use them. Fix handrails that are broken or loose. Make sure that handrails are as long as the stairways.  Check any carpeting to make sure that it is firmly attached to the stairs. Fix any carpet that is loose or worn.  Avoid having throw rugs at the top or bottom of the stairs. If you do have throw rugs, attach them to the floor with carpet tape.  Make sure that you have a light switch at the top of the stairs and the bottom of the stairs. If you do not have them, ask someone to add them for you. What else can I do to help prevent falls?  Wear shoes that:  Do not have high heels.  Have rubber bottoms.  Are comfortable and fit you well.  Are closed at the toe. Do not wear sandals.  If you use a stepladder:  Make sure that it is fully opened. Do not climb a closed stepladder.  Make sure that both sides of the stepladder are locked into place.  Ask someone to hold it  for you, if possible.  Clearly mark and make sure that you can see:  Any grab bars or handrails.  First and last steps.  Where the edge of each step is.  Use tools that help you move around (mobility aids) if they are needed. These include:  Canes.  Walkers.  Scooters.  Crutches.  Turn on the lights when you go into a dark area. Replace any light bulbs as soon as they burn out.  Set up your furniture so you have a clear path. Avoid moving your furniture around.  If any of your floors are uneven, fix them.  If there are any pets around you, be aware of where they are.  Review your medicines with your doctor. Some medicines can make you feel dizzy. This can increase your chance of falling. Ask your doctor what other things that you can do to help prevent falls. This information is not intended to replace advice given to you by your health care provider. Make sure you discuss any questions you have with your health care provider. Document Released: 02/15/2009 Document Revised: 09/27/2015 Document Reviewed: 05/26/2014 Elsevier Interactive Patient Education  2017 [employer].

## 2018-11-09 NOTE — Progress Notes (Signed)
Subjective:   Gabrielle [name] is a 73 y.o. female who presents for Medicare Annual (Subsequent) preventive examination.  This visit type was conducted due to national recommendations for restrictions regarding the COVID-19 Pandemic (e.g. social distancing). This format is felt to be most appropriate for this patient at this time. All issues noted in this document were discussed and addressed. No physical exam was performed (except for noted visual exam findings with Video Visits). The patient, Gabrielle [name], has given consent to perform this visit via video. Vital signs may be absent or patient reported.   Patient location:  At home   Nurse location:  [place] office  Review of Systems:  n/a Cardiac Risk Factors include: advanced age (>78men, >7 women);hypertension;sedentary lifestyle     Objective:     Vitals: Ht 5\' 5"  (1.651 m) Comment: per patient  Wt 220 lb (99.8 kg) Comment: per patient  BMI 36.61 kg/m   Body mass index is 36.61 kg/m.  Advanced Directives 11/09/2018 03/03/2018  Does Patient Have a Medical Advance Directive? Yes Yes  Type of Advance Directive Living will Living will  Does patient want to make changes to medical advance directive? - No - Patient declined    Tobacco Social History   Tobacco Use  Smoking Status Never Smoker  Smokeless Tobacco Never Used     Counseling given: Not Answered   Clinical Intake:  Pre-visit preparation completed: Yes  Pain : No/denies pain     Nutritional Status: BMI > 30  Obese Nutritional Risks: None Diabetes: No  How often do you need to have someone help you when you read instructions, pamphlets, or other written materials from your doctor or pharmacy?: 1 - Never What is the last grade level you completed in school?: some college  Interpreter Needed?: No  Information entered by :: NAllen LPN  Past Medical History:  Diagnosis Date  . Hemorrhoids   . Hypertension   . Obesity    Past Surgical History:   Procedure Laterality Date  . BREAST EXCISIONAL BIOPSY Left 1991  . BREAST SURGERY    . LAPAROSCOPIC GASTRIC BANDING  2009  . TUBAL LIGATION     Family History  Problem Relation Age of Onset  . COPD Mother        colon  . Heart disease Father   . Heart disease Brother    Social History   Socioeconomic History  . Marital status: Single    Spouse name: Not on file  . Number of children: Not on file  . Years of education: Not on file  . Highest education level: Not on file  Occupational History  . Occupation: retired  Scientific laboratory technician  . Financial resource strain: Not hard at all  . Food insecurity    Worry: Never true    Inability: Never true  . Transportation needs    Medical: No    Non-medical: No  Tobacco Use  . Smoking status: Never Smoker  . Smokeless tobacco: Never Used  Substance and Sexual Activity  . Alcohol use: No  . Drug use: No  . Sexual activity: Not Currently  Lifestyle  . Physical activity    Days per week: 0 days    Minutes per session: 0 min  . Stress: Not at all  Relationships  . Social Herbalist on phone: Not on file    Gets together: Not on file    Attends religious service: Not on file    Active member  of club or organization: Not on file    Attends meetings of clubs or organizations: Not on file    Relationship status: Not on file  Other Topics Concern  . Not on file  Social History Narrative  . Not on file    Outpatient Encounter Medications as of 11/09/2018  Medication Sig  . aspirin 81 MG tablet Take 81 mg by mouth daily.  Marland Kitchen. lisinopril-hydrochlorothiazide (PRINZIDE,ZESTORETIC) 20-12.5 MG tablet Take 1 tablet by mouth daily.  . Multiple Vitamins-Minerals (MULTIVITAMIN PO) Take 1 tablet by mouth daily.   . Vitamin D, Ergocalciferol, (DRISDOL) 50000 units CAPS capsule Take 50,000 Units by mouth 2 (two) times daily.    No facility-administered encounter medications on file as of 11/09/2018.     Activities of Daily Living In  your present state of health, do you have any difficulty performing the following activities: 11/09/2018 03/03/2018  Hearing? N N  Vision? N Y  Comment - sight has changed, but has annual exams. Small cataract being watched  Difficulty concentrating or making decisions? N N  Walking or climbing stairs? N N  Dressing or bathing? N N  Doing errands, shopping? N N  Preparing Food and eating ? N N  Using the Toilet? N N  In the past six months, have you accidently leaked urine? N N  Do you have problems with loss of bowel control? N N  Managing your Medications? N N  Managing your Finances? N N  Housekeeping or managing your Housekeeping? N N  Some recent data might be hidden    Patient Care Team: Dorothyann PengSanders, Robyn, MD as PCP - General (Internal Medicine) Sallye LatGroat, Christopher, MD as Consulting Physician (Ophthalmology)    Assessment:   This is a routine wellness examination for Gabrielle [name].  Exercise Activities and Dietary recommendations Current Exercise Habits: The patient does not participate in regular exercise at present  Goals    . Weight (lb) < 200 lb (90.7 kg) (pt-stated)     Would like to lose 8 pounds gained over last year       Fall Risk Fall Risk  11/09/2018 03/03/2018  Falls in the past year? 0 No  Risk for fall due to : Medication side effect Medication side effect  Follow up Falls evaluation completed;Falls prevention discussed -   Is the patient's home free of loose throw rugs in walkways, pet beds, electrical cords, etc?   yes      Grab bars in the bathroom? no      Handrails on the stairs?   yes      Adequate lighting?   yes  Timed Get Up and Go performed: n/a  Depression Screen PHQ 2/9 Scores 11/09/2018 03/03/2018  PHQ - 2 Score 0 0  PHQ- 9 Score 0 -     Cognitive Function     6CIT Screen 11/09/2018 03/03/2018  What Year? 0 points 0 points  What month? 0 points 0 points  What time? 0 points 0 points  Count back from 20 0 points 0 points  Months in reverse  0 points 0 points  Repeat phrase 0 points 0 points  Total Score 0 0    Immunization History  Administered Date(s) Administered  . Influenza, High Dose Seasonal PF 03/03/2018    Qualifies for Shingles Vaccine? yes  Screening Tests Health Maintenance  Topic Date Due  . PNA vac Low Risk Adult (1 of 2 - PCV13) 09/17/2012  . INFLUENZA VACCINE  12/04/2018  . MAMMOGRAM  01/22/2019  .  COLONOSCOPY  11/02/2022  . TETANUS/TDAP  01/11/2024  . DEXA SCAN  Completed  . Hepatitis C Screening  Completed    Cancer Screenings: Lung: Low Dose CT Chest recommended if Age 21-80 years, 30 pack-year currently smoking OR have quit w/in 15years. Patient does not qualify. Breast:  Up to date on Mammogram? Yes   Up to date of Bone Density/Dexa? Yes Colorectal: 10/2012  Additional Screenings: : Hepatitis C Screening: 01/29/2016     Plan:    6 CIT was 0. States had mammogram in March.   I have personally reviewed and noted the following in the patient's chart:   . Medical and social history . Use of alcohol, tobacco or illicit drugs  . Current medications and supplements . Functional ability and status . Nutritional status . Physical activity . Advanced directives . List of other physicians . Hospitalizations, surgeries, and ER visits in previous 12 months . Vitals . Screenings to include cognitive, depression, and falls . Referrals and appointments  In addition, I have reviewed and discussed with patient certain preventive protocols, quality metrics, and best practice recommendations. A written personalized care plan for preventive services as well as general preventive health recommendations were provided to patient.     Barb Merinoickeah E Ferrell Flam, LPN  4/5/40987/11/2018

## 2019-01-04 ENCOUNTER — Ambulatory Visit: Payer: Medicare Other | Admitting: Internal Medicine

## 2019-04-05 ENCOUNTER — Other Ambulatory Visit: Payer: Self-pay

## 2019-04-05 ENCOUNTER — Telehealth: Payer: Self-pay

## 2019-04-05 MED ORDER — LISINOPRIL-HYDROCHLOROTHIAZIDE 20-12.5 MG PO TABS: 1.0000 | ORAL_TABLET | Freq: Every day | ORAL | 0 refills | Status: DC

## 2019-04-05 NOTE — Telephone Encounter (Signed)
Pt called for refill. Sent 30 days left vm for pt to call back to make an appointment pt has not been seen

## 2019-05-11 ENCOUNTER — Other Ambulatory Visit: Payer: Self-pay

## 2019-05-11 ENCOUNTER — Ambulatory Visit: Payer: Medicare PPO | Admitting: Internal Medicine

## 2019-05-11 ENCOUNTER — Encounter: Payer: Self-pay | Admitting: Internal Medicine

## 2019-05-11 ENCOUNTER — Ambulatory Visit (INDEPENDENT_AMBULATORY_CARE_PROVIDER_SITE_OTHER): Payer: Medicare PPO

## 2019-05-11 VITALS — BP 130/74 | HR 88 | Temp 97.8°F | Ht 65.0 in | Wt 236.6 lb

## 2019-05-11 VITALS — BP 130/74 | HR 88 | Temp 97.8°F | Ht 65.5 in | Wt 236.0 lb

## 2019-05-11 DIAGNOSIS — N182 Chronic kidney disease, stage 2 (mild): Secondary | ICD-10-CM | POA: Diagnosis not present

## 2019-05-11 DIAGNOSIS — Z6839 Body mass index (BMI) 39.0-39.9, adult: Secondary | ICD-10-CM

## 2019-05-11 DIAGNOSIS — Z1231 Encounter for screening mammogram for malignant neoplasm of breast: Secondary | ICD-10-CM

## 2019-05-11 DIAGNOSIS — R7309 Other abnormal glucose: Secondary | ICD-10-CM | POA: Diagnosis not present

## 2019-05-11 DIAGNOSIS — Z0000 Encounter for general adult medical examination without abnormal findings: Secondary | ICD-10-CM | POA: Diagnosis not present

## 2019-05-11 DIAGNOSIS — I129 Hypertensive chronic kidney disease with stage 1 through stage 4 chronic kidney disease, or unspecified chronic kidney disease: Secondary | ICD-10-CM

## 2019-05-11 MED ORDER — LISINOPRIL-HYDROCHLOROTHIAZIDE 20-12.5 MG PO TABS: 1.0000 | ORAL_TABLET | Freq: Every day | ORAL | 2 refills | Status: DC

## 2019-05-11 NOTE — Patient Instructions (Signed)
Gabrielle [name] , Thank you for taking time to come for your Medicare Wellness Visit. I appreciate your ongoing commitment to your health goals. Please review the following plan we discussed and let me know if I can assist you in the future.   Screening recommendations/referrals: Colonoscopy: 10/2012 Mammogram: process of scheduling Bone Density: 02/2007 Recommended yearly ophthalmology/optometry visit for glaucoma screening and checkup Recommended yearly dental visit for hygiene and checkup  Vaccinations: Influenza vaccine: 12/2018 Pneumococcal vaccine: 07/2018 Tdap vaccine: 01/2014 Shingles vaccine: discussed    Advanced directives: Please bring a copy of your POA (Power of [place]) and/or Living Will to your next appointment.    Conditions/risks identified: obesity  Next appointment: 11/22/2019 at 9:15   Preventive Care 65 Years and Older, Female Preventive care refers to lifestyle choices and visits with your health care provider that can promote health and wellness. What does preventive care include?  A yearly physical exam. This is also called an annual well check.  Dental exams once or twice a year.  Routine eye exams. Ask your health care provider how often you should have your eyes checked.  Personal lifestyle choices, including:  Daily care of your teeth and gums.  Regular physical activity.  Eating a healthy diet.  Avoiding tobacco and drug use.  Limiting alcohol use.  Practicing safe sex.  Taking low-dose aspirin every day.  Taking vitamin and mineral supplements as recommended by your health care provider. What happens during an annual well check? The services and screenings done by your health care provider during your annual well check will depend on your age, overall health, lifestyle risk factors, and family history of disease. Counseling  Your health care provider may ask you questions about your:  Alcohol use.  Tobacco use.  Drug use.  Emotional  well-being.  Home and relationship well-being.  Sexual activity.  Eating habits.  History of falls.  Memory and ability to understand (cognition).  Work and work Astronomer.  Reproductive health. Screening  You may have the following tests or measurements:  Height, weight, and BMI.  Blood pressure.  Lipid and cholesterol levels. These may be checked every 5 years, or more frequently if you are over 16 years old.  Skin check.  Lung cancer screening. You may have this screening every year starting at age 54 if you have a 30-pack-year history of smoking and currently smoke or have quit within the past 15 years.  Fecal occult blood test (FOBT) of the stool. You may have this test every year starting at age 41.  Flexible sigmoidoscopy or colonoscopy. You may have a sigmoidoscopy every 5 years or a colonoscopy every 10 years starting at age 54.  Hepatitis C blood test.  Hepatitis B blood test.  Sexually transmitted disease (STD) testing.  Diabetes screening. This is done by checking your blood sugar (glucose) after you have not eaten for a while (fasting). You may have this done every 1-3 years.  Bone density scan. This is done to screen for osteoporosis. You may have this done starting at age 55.  Mammogram. This may be done every 1-2 years. Talk to your health care provider about how often you should have regular mammograms. Talk with your health care provider about your test results, treatment options, and if necessary, the need for more tests. Vaccines  Your health care provider may recommend certain vaccines, such as:  Influenza vaccine. This is recommended every year.  Tetanus, diphtheria, and acellular pertussis (Tdap, Td) vaccine. You may need a  Td booster every 10 years.  Zoster vaccine. You may need this after age 75.  Pneumococcal 13-valent conjugate (PCV13) vaccine. One dose is recommended after age 65.  Pneumococcal polysaccharide (PPSV23) vaccine. One  dose is recommended after age 47. Talk to your health care provider about which screenings and vaccines you need and how often you need them. This information is not intended to replace advice given to you by your health care provider. Make sure you discuss any questions you have with your health care provider. Document Released: 05/18/2015 Document Revised: 01/09/2016 Document Reviewed: 02/20/2015 Elsevier Interactive Patient Education  2017 [place] Prevention in the Home Falls can cause injuries. They can happen to people of all ages. There are many things you can do to make your home safe and to help prevent falls. What can I do on the outside of my home?  Regularly fix the edges of walkways and driveways and fix any cracks.  Remove anything that might make you trip as you walk through a door, such as a raised step or threshold.  Trim any bushes or trees on the path to your home.  Use bright outdoor lighting.  Clear any walking paths of anything that might make someone trip, such as rocks or tools.  Regularly check to see if handrails are loose or broken. Make sure that both sides of any steps have handrails.  Any raised decks and porches should have guardrails on the edges.  Have any leaves, snow, or ice cleared regularly.  Use sand or salt on walking paths during winter.  Clean up any spills in your garage right away. This includes oil or grease spills. What can I do in the bathroom?  Use night lights.  Install grab bars by the toilet and in the tub and shower. Do not use towel bars as grab bars.  Use non-skid mats or decals in the tub or shower.  If you need to sit down in the shower, use a plastic, non-slip stool.  Keep the floor dry. Clean up any water that spills on the floor as soon as it happens.  Remove soap buildup in the tub or shower regularly.  Attach bath mats securely with double-sided non-slip rug tape.  Do not have throw rugs and other  things on the floor that can make you trip. What can I do in the bedroom?  Use night lights.  Make sure that you have a light by your bed that is easy to reach.  Do not use any sheets or blankets that are too big for your bed. They should not hang down onto the floor.  Have a firm chair that has side arms. You can use this for support while you get dressed.  Do not have throw rugs and other things on the floor that can make you trip. What can I do in the kitchen?  Clean up any spills right away.  Avoid walking on wet floors.  Keep items that you use a lot in easy-to-reach places.  If you need to reach something above you, use a strong step stool that has a grab bar.  Keep electrical cords out of the way.  Do not use floor polish or wax that makes floors slippery. If you must use wax, use non-skid floor wax.  Do not have throw rugs and other things on the floor that can make you trip. What can I do with my stairs?  Do not leave any items on the stairs.  Make sure that there are handrails on both sides of the stairs and use them. Fix handrails that are broken or loose. Make sure that handrails are as long as the stairways.  Check any carpeting to make sure that it is firmly attached to the stairs. Fix any carpet that is loose or worn.  Avoid having throw rugs at the top or bottom of the stairs. If you do have throw rugs, attach them to the floor with carpet tape.  Make sure that you have a light switch at the top of the stairs and the bottom of the stairs. If you do not have them, ask someone to add them for you. What else can I do to help prevent falls?  Wear shoes that:  Do not have high heels.  Have rubber bottoms.  Are comfortable and fit you well.  Are closed at the toe. Do not wear sandals.  If you use a stepladder:  Make sure that it is fully opened. Do not climb a closed stepladder.  Make sure that both sides of the stepladder are locked into place.  Ask  someone to hold it for you, if possible.  Clearly mark and make sure that you can see:  Any grab bars or handrails.  First and last steps.  Where the edge of each step is.  Use tools that help you move around (mobility aids) if they are needed. These include:  Canes.  Walkers.  Scooters.  Crutches.  Turn on the lights when you go into a dark area. Replace any light bulbs as soon as they burn out.  Set up your furniture so you have a clear path. Avoid moving your furniture around.  If any of your floors are uneven, fix them.  If there are any pets around you, be aware of where they are.  Review your medicines with your doctor. Some medicines can make you feel dizzy. This can increase your chance of falling. Ask your doctor what other things that you can do to help prevent falls. This information is not intended to replace advice given to you by your health care provider. Make sure you discuss any questions you have with your health care provider. Document Released: 02/15/2009 Document Revised: 09/27/2015 Document Reviewed: 05/26/2014 Elsevier Interactive Patient Education  2017 [employer].

## 2019-05-11 NOTE — Patient Instructions (Signed)
Exercising to Stay Healthy To become healthy and stay healthy, it is recommended that you do moderate-intensity and vigorous-intensity exercise. You can tell that you are exercising at a moderate intensity if your heart starts beating faster and you start breathing faster but can still hold a conversation. You can tell that you are exercising at a vigorous intensity if you are breathing much harder and faster and cannot hold a conversation while exercising. Exercising regularly is important. It has many health benefits, such as:  Improving overall fitness, flexibility, and endurance.  Increasing bone density.  Helping with weight control.  Decreasing body fat.  Increasing muscle strength.  Reducing stress and tension.  Improving overall health. How often should I exercise? Choose an activity that you enjoy, and set realistic goals. Your health care provider can help you make an activity plan that works for you. Exercise regularly as told by your health care provider. This may include:  Doing strength training two times a week, such as: ? Lifting weights. ? Using resistance bands. ? Push-ups. ? Sit-ups. ? Yoga.  Doing a certain intensity of exercise for a given amount of time. Choose from these options: ? A total of 150 minutes of moderate-intensity exercise every week. ? A total of 75 minutes of vigorous-intensity exercise every week. ? A mix of moderate-intensity and vigorous-intensity exercise every week. Children, pregnant women, people who have not exercised regularly, people who are overweight, and older adults may need to talk with a health care provider about what activities are safe to do. If you have a medical condition, be sure to talk with your health care provider before you start a new exercise program. What are some exercise ideas? Moderate-intensity exercise ideas include:  Walking 1 mile (1.6 km) in about 15  minutes.  Biking.  Hiking.  Golfing.  Dancing.  Water aerobics. Vigorous-intensity exercise ideas include:  Walking 4.5 miles (7.2 km) or more in about 1 hour.  Jogging or running 5 miles (8 km) in about 1 hour.  Biking 10 miles (16.1 km) or more in about 1 hour.  Lap swimming.  Roller-skating or in-line skating.  Cross-country skiing.  Vigorous competitive sports, such as football, basketball, and soccer.  Jumping rope.  Aerobic dancing. What are some everyday activities that can help me to get exercise?  Yard work, such as: ? Pushing a lawn mower. ? Raking and bagging leaves.  Washing your car.  Pushing a stroller.  Shoveling snow.  Gardening.  Washing windows or floors. How can I be more active in my day-to-day activities?  Use stairs instead of an elevator.  Take a walk during your lunch break.  If you drive, park your car farther away from your work or school.  If you take public transportation, get off one stop early and walk the rest of the way.  Stand up or walk around during all of your indoor phone calls.  Get up, stretch, and walk around every 30 minutes throughout the day.  Enjoy exercise with a friend. Support to continue exercising will help you keep a regular routine of activity. What guidelines can I follow while exercising?  Before you start a new exercise program, talk with your health care provider.  Do not exercise so much that you hurt yourself, feel dizzy, or get very short of breath.  Wear comfortable clothes and wear shoes with good support.  Drink plenty of water while you exercise to prevent dehydration or heat stroke.  Work out until your breathing   and your heartbeat get faster. Where to find more information  [place] Department of Health and Human Services: www.hhs.gov  Centers for Disease Control and Prevention (CDC): www.cdc.gov Summary  Exercising regularly is important. It will improve your overall fitness,  flexibility, and endurance.  Regular exercise also will improve your overall health. It can help you control your weight, reduce stress, and improve your bone density.  Do not exercise so much that you hurt yourself, feel dizzy, or get very short of breath.  Before you start a new exercise program, talk with your health care provider. This information is not intended to replace advice given to you by your health care provider. Make sure you discuss any questions you have with your health care provider. Document Revised: 04/03/2017 Document Reviewed: 03/12/2017 Elsevier Patient Education  2020 Elsevier Inc.  

## 2019-05-11 NOTE — Progress Notes (Signed)
This visit occurred during the SARS-CoV-2 public health emergency.  Safety protocols were in place, including screening questions prior to the visit, additional usage of staff PPE, and extensive cleaning of exam room while observing appropriate contact time as indicated for disinfecting solutions.  Subjective:   Gabrielle [name] is a 74 y.o. female who presents for Medicare Annual (Subsequent) preventive examination.  Review of Systems:  n/a Cardiac Risk Factors include: advanced age (>58men, >38 women);hypertension;obesity (BMI >30kg/m2);sedentary lifestyle     Objective:     Vitals: BP 130/74   Pulse 88   Temp 97.8 F (36.6 C) (Oral)   Ht 5' 5.5" (1.664 m)   Wt 236 lb (107 kg)   BMI 38.68 kg/m   Body mass index is 38.68 kg/m.  Advanced Directives 05/11/2019 11/09/2018 03/03/2018  Does Patient Have a Medical Advance Directive? Yes Yes Yes  Type of Estate agent of [place];Living will Living will Living will  Does patient want to make changes to medical advance directive? - - No - Patient declined  Copy of Healthcare Power of Attorney in Chart? No - copy requested - -    Tobacco Social History   Tobacco Use  Smoking Status Never Smoker  Smokeless Tobacco Never Used     Counseling given: Not Answered   Clinical Intake:  Pre-visit preparation completed: Yes  Pain : No/denies pain     Nutritional Status: BMI > 30  Obese Diabetes: No  How often do you need to have someone help you when you read instructions, pamphlets, or other written materials from your doctor or pharmacy?: 1 - Never What is the last grade level you completed in school?: some college  Interpreter Needed?: No  Information entered by :: NAllen LPN  Past Medical History:  Diagnosis Date  . Hemorrhoids   . Hypertension   . Obesity    Past Surgical History:  Procedure Laterality Date  . BREAST EXCISIONAL BIOPSY Left 1991  . BREAST SURGERY    . LAPAROSCOPIC GASTRIC BANDING   2009  . TUBAL LIGATION     Family History  Problem Relation Age of Onset  . COPD Mother        colon  . Heart disease Father   . Heart disease Brother    Social History   Socioeconomic History  . Marital status: Single    Spouse name: Not on file  . Number of children: Not on file  . Years of education: Not on file  . Highest education level: Not on file  Occupational History  . Occupation: retired  Tobacco Use  . Smoking status: Never Smoker  . Smokeless tobacco: Never Used  Substance and Sexual Activity  . Alcohol use: No  . Drug use: No  . Sexual activity: Not Currently  Other Topics Concern  . Not on file  Social History Narrative  . Not on file   Social Determinants of Health   Financial Resource Strain: Low Risk   . Difficulty of Paying Living Expenses: Not hard at all  Food Insecurity: No Food Insecurity  . Worried About Programme researcher, broadcasting/film/video in the Last Year: Never true  . Ran Out of Food in the Last Year: Never true  Transportation Needs: No Transportation Needs  . Lack of Transportation (Medical): No  . Lack of Transportation (Non-Medical): No  Physical Activity: Inactive  . Days of Exercise per Week: 0 days  . Minutes of Exercise per Session: 0 min  Stress: No Stress Concern Present  .  Feeling of Stress : Only a little  Social Connections:   . Frequency of Communication with Friends and Family: Not on file  . Frequency of Social Gatherings with Friends and Family: Not on file  . Attends Religious Services: Not on file  . Active Member of Clubs or Organizations: Not on file  . Attends Banker Meetings: Not on file  . Marital Status: Not on file    Outpatient Encounter Medications as of 05/11/2019  Medication Sig  . aspirin 81 MG tablet Take 81 mg by mouth daily.  . Multiple Vitamins-Minerals (MULTIVITAMIN PO) Take 1 tablet by mouth daily.   . Vitamin D, Ergocalciferol, (DRISDOL) 50000 units CAPS capsule Take 50,000 Units by mouth 2 (two)  times daily.   . [DISCONTINUED] lisinopril-hydrochlorothiazide (ZESTORETIC) 20-12.5 MG tablet Take 1 tablet by mouth daily.   No facility-administered encounter medications on file as of 05/11/2019.    Activities of Daily Living In your present state of health, do you have any difficulty performing the following activities: 05/11/2019 11/09/2018  Hearing? N N  Vision? N N  Difficulty concentrating or making decisions? N N  Walking or climbing stairs? N N  Dressing or bathing? N N  Doing errands, shopping? N N  Preparing Food and eating ? N N  Using the Toilet? N N  In the past six months, have you accidently leaked urine? N N  Do you have problems with loss of bowel control? N N  Managing your Medications? N N  Managing your Finances? N N  Housekeeping or managing your Housekeeping? N N  Some recent data might be hidden    Patient Care Team: Dorothyann Peng, MD as PCP - General (Internal Medicine) Sallye Lat, MD as Consulting Physician (Ophthalmology)    Assessment:   This is a routine wellness examination for Gabrielle [name].  Exercise Activities and Dietary recommendations Current Exercise Habits: The patient does not participate in regular exercise at present  Goals    . Patient Stated     05/11/2019, wants to get back into the gym once she gets covid vaccine    . Weight (lb) < 200 lb (90.7 kg) (pt-stated)     Would like to lose 8 pounds gained over last year       Fall Risk Fall Risk  05/11/2019 05/11/2019 11/09/2018 03/03/2018  Falls in the past year? 0 0 0 No  Risk for fall due to : Medication side effect - Medication side effect Medication side effect  Follow up Falls evaluation completed;Education provided;Falls prevention discussed - Falls evaluation completed;Falls prevention discussed -   Is the patient's home free of loose throw rugs in walkways, pet beds, electrical cords, etc?   yes      Grab bars in the bathroom? no      Handrails on the stairs?   yes       Adequate lighting?   yes  Timed Get Up and Go performed: n/a  Depression Screen PHQ 2/9 Scores 05/11/2019 05/11/2019 11/09/2018 03/03/2018  PHQ - 2 Score 0 0 0 0  PHQ- 9 Score 0 - 0 -     Cognitive Function     6CIT Screen 05/11/2019 11/09/2018 03/03/2018  What Year? 0 points 0 points 0 points  What month? 0 points 0 points 0 points  What time? 0 points 0 points 0 points  Count back from 20 0 points 0 points 0 points  Months in reverse 0 points 0 points 0 points  Repeat phrase  0 points 0 points 0 points  Total Score 0 0 0    Immunization History  Administered Date(s) Administered  . Influenza, High Dose Seasonal PF 03/03/2018  . Influenza,inj,Quad PF,6+ Mos 01/01/2019  . Pneumococcal Conjugate-13 07/07/2018    Qualifies for Shingles Vaccine? yes  Screening Tests Health Maintenance  Topic Date Due  . MAMMOGRAM  01/22/2019  . PNA vac Low Risk Adult (2 of 2 - PPSV23) 07/07/2019  . COLONOSCOPY  11/02/2022  . TETANUS/TDAP  01/11/2024  . INFLUENZA VACCINE  Completed  . DEXA SCAN  Completed  . Hepatitis C Screening  Completed    Cancer Screenings: Lung: Low Dose CT Chest recommended if Age 72-80 years, 30 pack-year currently smoking OR have quit w/in 15years. Patient does not qualify. Breast:  Up to date on Mammogram? No   Up to date of Bone Density/Dexa? Yes Colorectal: working on scheduling   Additional Screenings: : Hepatitis C Screening: 01/2016     Plan:    Patient wants to get back in the gym once she is able to get the covid vaccine.   I have personally reviewed and noted the following in the patient's chart:   . Medical and social history . Use of alcohol, tobacco or illicit drugs  . Current medications and supplements . Functional ability and status . Nutritional status . Physical activity . Advanced directives . List of other physicians . Hospitalizations, surgeries, and ER visits in previous 12 months . Vitals . Screenings to include cognitive,  depression, and falls . Referrals and appointments  In addition, I have reviewed and discussed with patient certain preventive protocols, quality metrics, and best practice recommendations. A written personalized care plan for preventive services as well as general preventive health recommendations were provided to patient.     Kellie Simmering, LPN  12/11/3808

## 2019-05-11 NOTE — Progress Notes (Signed)
This visit occurred during the SARS-CoV-2 public health emergency.  Safety protocols were in place, including screening questions prior to the visit, additional usage of staff PPE, and extensive cleaning of exam room while observing appropriate contact time as indicated for disinfecting solutions.  Subjective:     Patient ID: Gabrielle [name] , female    DOB: 10-28-1945 , 74 y.o.   MRN: [id number]   Chief Complaint  Patient presents with  . Hypertension    HPI  She is here today for BP check and refill of her medications. She has no other concerns at this time.   Hypertension This is a chronic problem. The current episode started more than 1 year ago. The problem has been gradually improving since onset. The problem is controlled. Pertinent negatives include no blurred vision, chest pain, palpitations or shortness of breath. Risk factors for coronary artery disease include sedentary lifestyle, post-menopausal state and obesity. Hypertensive end-organ damage includes kidney disease.     Past Medical History:  Diagnosis Date  . Hemorrhoids   . Hypertension   . Obesity      Family History  Problem Relation Age of Onset  . COPD Mother        colon  . Heart disease Father   . Heart disease Brother      Current Outpatient Medications:  .  aspirin 81 MG tablet, Take 81 mg by mouth daily., Disp: , Rfl:  .  lisinopril-hydrochlorothiazide (ZESTORETIC) 20-12.5 MG tablet, Take 1 tablet by mouth daily., Disp: 90 tablet, Rfl: 2 .  Multiple Vitamins-Minerals (MULTIVITAMIN PO), Take 1 tablet by mouth daily. , Disp: , Rfl:  .  Vitamin D, Ergocalciferol, (DRISDOL) 50000 units CAPS capsule, Take 50,000 Units by mouth 2 (two) times daily. , Disp: , Rfl:    No Known Allergies   Review of Systems  Constitutional: Negative.   Eyes: Negative for blurred vision.  Respiratory: Negative.  Negative for shortness of breath.   Cardiovascular: Negative.  Negative for chest pain and palpitations.   Gastrointestinal: Negative.   Neurological: Negative.   Psychiatric/Behavioral: Negative.      Today's Vitals   05/11/19 1147  BP: 130/74  Pulse: 88  Temp: 97.8 F (36.6 C)  Weight: 236 lb 9.6 oz (107.3 kg)  Height: '5\' 5"'  (1.651 m)   Body mass index is 39.37 kg/m.   Objective:  Physical Exam Vitals and nursing note reviewed.  Constitutional:      Appearance: Normal appearance. She is obese.  HENT:     Head: Normocephalic and atraumatic.  Cardiovascular:     Rate and Rhythm: Normal rate and regular rhythm.     Heart sounds: Normal heart sounds.  Pulmonary:     Effort: Pulmonary effort is normal.     Breath sounds: Normal breath sounds.  Skin:    General: Skin is warm.  Neurological:     General: No focal deficit present.     Mental Status: She is alert.  Psychiatric:        Mood and Affect: Mood normal.        Behavior: Behavior normal.         Assessment And Plan:     1. Hypertensive nephropathy  Chronic, controlled. She will continue with current meds. I will check labs as listed below. She will rto in July 2021 for her next physical examination and AWV.   - CMP14+EGFR - CBC - Lipid panel  2. Chronic renal disease, stage II  Chronic, yet stable. I  will check GFR, Cr today. She is encouraged to stay well hydrated.   3. Other abnormal glucose  HER A1C HAS BEEN ELEVATED IN THE PAST. I WILL CHECK AN A1C, BMET TODAY. SHE WAS ENCOURAGED TO AVOID SUGARY BEVERAGES AND PROCESSED FOODS INCLUDNG BREADS, RICE AND PASTA.  - Hemoglobin A1c  4. Class 2 severe obesity due to excess calories with serious comorbidity and body mass index (BMI) of 39.0 to 39.9 in adult [facility])  She is s/p lap band surgery. She is encouraged to strive for BMI less than 34 to decrease cardiac risk. I encouraged her to aim for at least 150 minutes per week.   5. Breast cancer screening by mammogram  [facility] She does not wish to schedule until July  2021. She prefers to have vaccine prior to having further studies performed.     Maximino Greenland, MD    THE PATIENT IS ENCOURAGED TO PRACTICE SOCIAL DISTANCING DUE TO THE COVID-19 PANDEMIC.

## 2019-05-12 LAB — CMP14+EGFR
ALT: 12 IU/L (ref 0–32)
AST: 15 IU/L (ref 0–40)
Albumin/Globulin Ratio: 1.6 (ref 1.2–2.2)
Albumin: 4.1 g/dL (ref 3.7–4.7)
Alkaline Phosphatase: 67 IU/L (ref 39–117)
BUN/Creatinine Ratio: 13 (ref 12–28)
BUN: 14 mg/dL (ref 8–27)
Bilirubin Total: 0.3 mg/dL (ref 0.0–1.2)
CO2: 26 mmol/L (ref 20–29)
Calcium: 9.8 mg/dL (ref 8.7–10.3)
Chloride: 99 mmol/L (ref 96–106)
Creatinine, Ser: 1.04 mg/dL — ABNORMAL HIGH (ref 0.57–1.00)
GFR calc Af Amer: 62 mL/min/{1.73_m2} (ref >59)
GFR calc non Af Amer: 54 mL/min/{1.73_m2} — ABNORMAL LOW (ref >59)
Globulin, Total: 2.5 g/dL (ref 1.5–4.5)
Glucose: 97 mg/dL (ref 65–99)
Potassium: 4.4 mmol/L (ref 3.5–5.2)
Sodium: 139 mmol/L (ref 134–144)
Total Protein: 6.6 g/dL (ref 6.0–8.5)

## 2019-05-12 LAB — CBC
Hematocrit: 37.9 % (ref 34.0–46.6)
Hemoglobin: 12.3 g/dL (ref 11.1–15.9)
MCH: 29.4 pg (ref 26.6–33.0)
MCHC: 32.5 g/dL (ref 31.5–35.7)
MCV: 91 fL (ref 79–97)
Platelets: 341 10*3/uL (ref 150–450)
RBC: 4.19 x10E6/uL (ref 3.77–5.28)
RDW: 13.3 % (ref 11.7–15.4)
WBC: 9.3 10*3/uL (ref 3.4–10.8)

## 2019-05-12 LAB — LIPID PANEL
Chol/HDL Ratio: 4.1 ratio (ref 0.0–4.4)
Cholesterol, Total: 351 mg/dL — ABNORMAL HIGH (ref 100–199)
HDL: 86 mg/dL (ref >39)
LDL Chol Calc (NIH): 252 mg/dL — ABNORMAL HIGH (ref 0–99)
Triglycerides: 87 mg/dL (ref 0–149)
VLDL Cholesterol Cal: 13 mg/dL (ref 5–40)

## 2019-05-12 LAB — HEMOGLOBIN A1C
Est. average glucose Bld gHb Est-mCnc: 117 mg/dL
Hgb A1c MFr Bld: 5.7 % — ABNORMAL HIGH (ref 4.8–5.6)

## 2019-06-11 ENCOUNTER — Encounter: Payer: Self-pay | Admitting: Internal Medicine

## 2019-07-15 ENCOUNTER — Encounter: Payer: Self-pay | Admitting: Internal Medicine

## 2019-07-20 DIAGNOSIS — R1310 Dysphagia, unspecified: Secondary | ICD-10-CM | POA: Diagnosis not present

## 2019-07-26 ENCOUNTER — Other Ambulatory Visit: Payer: Self-pay | Admitting: Surgery

## 2019-07-26 DIAGNOSIS — K9509 Other complications of gastric band procedure: Secondary | ICD-10-CM

## 2019-08-04 ENCOUNTER — Other Ambulatory Visit: Payer: Self-pay | Admitting: Surgery

## 2019-08-04 ENCOUNTER — Ambulatory Visit
Admission: RE | Admit: 2019-08-04 | Discharge: 2019-08-04 | Disposition: A | Discharge status: Home / Self Care | Payer: Medicare PPO | Source: Ambulatory Visit | Attending: Surgery | Admitting: Surgery

## 2019-08-04 DIAGNOSIS — K224 Dyskinesia of esophagus: Secondary | ICD-10-CM | POA: Diagnosis not present

## 2019-08-04 DIAGNOSIS — K9509 Other complications of gastric band procedure: Secondary | ICD-10-CM

## 2019-11-10 ENCOUNTER — Ambulatory Visit: Payer: Medicare Other

## 2019-11-10 ENCOUNTER — Ambulatory Visit: Payer: Medicare Other | Admitting: Internal Medicine

## 2019-11-22 ENCOUNTER — Encounter: Payer: Medicare Other | Admitting: Internal Medicine

## 2020-02-11 ENCOUNTER — Other Ambulatory Visit: Payer: Self-pay | Admitting: Internal Medicine

## 2020-02-13 ENCOUNTER — Other Ambulatory Visit: Payer: Self-pay | Admitting: Internal Medicine

## 2020-04-06 ENCOUNTER — Encounter: Payer: Self-pay | Admitting: Internal Medicine

## 2020-05-16 ENCOUNTER — Ambulatory Visit (INDEPENDENT_AMBULATORY_CARE_PROVIDER_SITE_OTHER): Payer: Medicare PPO | Admitting: Internal Medicine

## 2020-05-16 ENCOUNTER — Other Ambulatory Visit: Payer: Self-pay

## 2020-05-16 ENCOUNTER — Encounter: Payer: Self-pay | Admitting: Internal Medicine

## 2020-05-16 ENCOUNTER — Ambulatory Visit: Payer: Medicare PPO

## 2020-05-16 VITALS — BP 138/82 | HR 66 | Temp 97.7°F | Ht 64.0 in | Wt 233.0 lb

## 2020-05-16 VITALS — BP 138/62 | HR 66 | Temp 97.4°F | Ht 64.0 in | Wt 233.0 lb

## 2020-05-16 DIAGNOSIS — R7309 Other abnormal glucose: Secondary | ICD-10-CM | POA: Diagnosis not present

## 2020-05-16 DIAGNOSIS — Z0000 Encounter for general adult medical examination without abnormal findings: Secondary | ICD-10-CM | POA: Diagnosis not present

## 2020-05-16 DIAGNOSIS — Z6839 Body mass index (BMI) 39.0-39.9, adult: Secondary | ICD-10-CM | POA: Diagnosis not present

## 2020-05-16 DIAGNOSIS — N182 Chronic kidney disease, stage 2 (mild): Secondary | ICD-10-CM | POA: Diagnosis not present

## 2020-05-16 DIAGNOSIS — Z23 Encounter for immunization: Secondary | ICD-10-CM | POA: Diagnosis not present

## 2020-05-16 DIAGNOSIS — I129 Hypertensive chronic kidney disease with stage 1 through stage 4 chronic kidney disease, or unspecified chronic kidney disease: Secondary | ICD-10-CM

## 2020-05-16 MED ORDER — SHINGRIX 50 MCG/0.5ML IM SUSR: 0.5000 mL | Freq: Once | INTRAMUSCULAR | 0 refills | Status: AC

## 2020-05-16 MED ORDER — LISINOPRIL-HYDROCHLOROTHIAZIDE 20-12.5 MG PO TABS: 1.0000 | ORAL_TABLET | Freq: Every day | ORAL | 1 refills | Status: DC

## 2020-05-16 NOTE — Progress Notes (Signed)
This visit occurred during the SARS-CoV-2 public health emergency.  Safety protocols were in place, including screening questions prior to the visit, additional usage of staff PPE, and extensive cleaning of exam room while observing appropriate contact time as indicated for disinfecting solutions.  Subjective:   Gabrielle [name] is a 75 y.o. female who presents for Medicare Annual (Subsequent) preventive examination.  Review of Systems     Cardiac Risk Factors include: advanced age (>23men, >32 women);hypertension;obesity (BMI >30kg/m2);sedentary lifestyle     Objective:    Today's Vitals   05/16/20 1106  BP: 138/82  Pulse: 66  Temp: 97.7 F (36.5 C)  TempSrc: Oral  SpO2: 98%  Weight: 233 lb (105.7 kg)  Height: 5\' 4"  (1.626 m)   Body mass index is 39.99 kg/m.  Advanced Directives 05/16/2020 05/11/2019 11/09/2018 03/03/2018  Does Patient Have a Medical Advance Directive? Yes Yes Yes Yes  Type of 03/05/2018 of [place];Living will Healthcare Power of [place];Living will Living will Living will  Does patient want to make changes to medical advance directive? - - - No - Patient declined  Copy of Healthcare Power of Attorney in Chart? No - copy requested No - copy requested - -    Current Medications (verified) Outpatient Encounter Medications as of 05/16/2020  Medication Sig  . aspirin 81 MG tablet Take 81 mg by mouth daily.  07/14/2020 lisinopril-hydrochlorothiazide (ZESTORETIC) 20-12.5 MG tablet TAKE 1 TABLET BY MOUTH DAILY  . Multiple Vitamins-Minerals (MULTIVITAMIN PO) Take 1 tablet by mouth daily.   . Vitamin D, Ergocalciferol, (DRISDOL) 50000 units CAPS capsule Take 50,000 Units by mouth 2 (two) times daily.    No facility-administered encounter medications on file as of 05/16/2020.    Allergies (verified) Patient has no known allergies.   History: Past Medical History:  Diagnosis Date  . Hemorrhoids   . Hypertension   . Obesity    Past Surgical  History:  Procedure Laterality Date  . BREAST EXCISIONAL BIOPSY Left 1991  . BREAST SURGERY    . LAPAROSCOPIC GASTRIC BANDING  2009  . TUBAL LIGATION     Family History  Problem Relation Age of Onset  . COPD Mother        colon  . Heart disease Father   . Heart disease Brother    Social History   Socioeconomic History  . Marital status: Single    Spouse name: Not on file  . Number of children: Not on file  . Years of education: Not on file  . Highest education level: Not on file  Occupational History  . Occupation: retired  Tobacco Use  . Smoking status: Never Smoker  . Smokeless tobacco: Never Used  Vaping Use  . Vaping Use: Never used  Substance and Sexual Activity  . Alcohol use: No  . Drug use: No  . Sexual activity: Not Currently  Other Topics Concern  . Not on file  Social History Narrative  . Not on file   Social Determinants of Health   Financial Resource Strain: Low Risk   . Difficulty of Paying Living Expenses: Not hard at all  Food Insecurity: No Food Insecurity  . Worried About 2010 in the Last Year: Never true  . Ran Out of Food in the Last Year: Never true  Transportation Needs: No Transportation Needs  . Lack of Transportation (Medical): No  . Lack of Transportation (Non-Medical): No  Physical Activity: Inactive  . Days of Exercise per Week: 0 days  .  Minutes of Exercise per Session: 0 min  Stress: No Stress Concern Present  . Feeling of Stress : Not at all  Social Connections: Not on file    Tobacco Counseling Counseling given: Not Answered   Clinical Intake:  Pre-visit preparation completed: Yes  Pain : No/denies pain     Nutritional Status: BMI > 30  Obese Nutritional Risks: None Diabetes: No  How often do you need to have someone help you when you read instructions, pamphlets, or other written materials from your doctor or pharmacy?: 1 - Never What is the last grade level you completed in school?: some  college  Diabetic? no  Interpreter Needed?: No  Information entered by :: NAllen LPN   Activities of Daily Living In your present state of health, do you have any difficulty performing the following activities: 05/16/2020  Hearing? N  Vision? N  Difficulty concentrating or making decisions? N  Walking or climbing stairs? N  Dressing or bathing? N  Doing errands, shopping? N  Preparing Food and eating ? N  Using the Toilet? N  In the past six months, have you accidently leaked urine? N  Do you have problems with loss of bowel control? N  Managing your Medications? N  Managing your Finances? N  Housekeeping or managing your Housekeeping? N  Some recent data might be hidden    Patient Care Team: Dorothyann Peng, MD as PCP - General (Internal Medicine) Sallye Lat, MD as Consulting Physician (Ophthalmology)  Indicate any recent Medical Services you may have received from other than [facility] providers in the past year (date may be approximate).     Assessment:   This is a routine wellness examination for Gabrielle [name].  Hearing/Vision screen No exam data present  Dietary issues and exercise activities discussed: Current Exercise Habits: The patient does not participate in regular exercise at present  Goals    .  Patient Stated      05/11/2019, wants to get back into the gym once she gets covid vaccine    .  Patient Stated      05/16/2020, stay in best health she can    .  Weight (lb) < 200 lb (90.7 kg) (pt-stated)      Would like to lose 8 pounds gained over last year      Depression Screen PHQ 2/9 Scores 05/16/2020 05/11/2019 05/11/2019 11/09/2018 03/03/2018  PHQ - 2 Score 0 0 0 0 0  PHQ- 9 Score - 0 - 0 -    Fall Risk Fall Risk  05/16/2020 05/11/2019 05/11/2019 11/09/2018 03/03/2018  Falls in the past year? 1 0 0 0 No  Comment tripped over suitcase - - - -  Number falls in past yr: 0 - - - -  Injury with Fall? 0 - - - -  Risk for fall due to : Medication side effect  Medication side effect - Medication side effect Medication side effect  Follow up Falls evaluation completed;Education provided;Falls prevention discussed Falls evaluation completed;Education provided;Falls prevention discussed - Falls evaluation completed;Falls prevention discussed -    FALL RISK PREVENTION PERTAINING TO THE HOME:  Any stairs in or around the home? Yes  If so, are there any without handrails? No  Home free of loose throw rugs in walkways, pet beds, electrical cords, etc? Yes  Adequate lighting in your home to reduce risk of falls? Yes   ASSISTIVE DEVICES UTILIZED TO PREVENT FALLS:  Life alert? No  Use of a cane, walker or w/c? No  Grab bars in the bathroom? No  Shower chair or bench in shower? No  Elevated toilet seat or a handicapped toilet? No   TIMED UP AND GO:  Was the test performed? No .    Gait steady and fast without use of assistive device  Cognitive Function:     6CIT Screen 05/16/2020 05/11/2019 11/09/2018 03/03/2018  What Year? 0 points 0 points 0 points 0 points  What month? 0 points 0 points 0 points 0 points  What time? 0 points 0 points 0 points 0 points  Count back from 20 0 points 0 points 0 points 0 points  Months in reverse 0 points 0 points 0 points 0 points  Repeat phrase 0 points 0 points 0 points 0 points  Total Score 0 0 0 0    Immunizations Immunization History  Administered Date(s) Administered  . Fluad Quad(high Dose 65+) 01/01/2019  . Influenza, High Dose Seasonal PF 03/03/2018  . Influenza,inj,Quad PF,6+ Mos 01/01/2019  . Influenza-Unspecified 01/24/2020  . Moderna Sars-Covid-2 Vaccination 06/10/2019, 07/13/2019, 03/11/2020  . Pneumococcal Conjugate-13 07/07/2018    TDAP status: Up to date  Flu Vaccine status: Up to date  Pneumococcal vaccine status: Completed during today's visit.  Covid-19 vaccine status: Completed vaccines  Qualifies for Shingles Vaccine? Yes   Zostavax completed No   Shingrix Completed?: No.     Education has been provided regarding the importance of this vaccine. Patient has been advised to call insurance company to determine out of pocket expense if they have not yet received this vaccine. Advised may also receive vaccine at local pharmacy or Health Dept. Verbalized acceptance and understanding.  Screening Tests Health Maintenance  Topic Date Due  . MAMMOGRAM  01/22/2019  . PNA vac Low Risk Adult (2 of 2 - PPSV23) 07/07/2019  . COLONOSCOPY (Pts 45-73yrs Insurance coverage will need to be confirmed)  11/02/2022  . TETANUS/TDAP  01/11/2024  . INFLUENZA VACCINE  Completed  . DEXA SCAN  Completed  . COVID-19 Vaccine  Completed  . Hepatitis C Screening  Completed    Health Maintenance  Health Maintenance Due  Topic Date Due  . MAMMOGRAM  01/22/2019  . PNA vac Low Risk Adult (2 of 2 - PPSV23) 07/07/2019    Colorectal cancer screening: Type of screening: Colonoscopy. Completed 11/01/2012. Repeat every 10 years  Mammogram status: patient to schedule  Bone Density status: Completed 03/02/2007.   Lung Cancer Screening: (Low Dose CT Chest recommended if Age 11-80 years, 30 pack-year currently smoking OR have quit w/in 15years.) does not qualify.   Lung Cancer Screening Referral: no  Additional Screening:  Hepatitis C Screening: does qualify; Completed 01/29/2016  Vision Screening: Recommended annual ophthalmology exams for early detection of glaucoma and other disorders of the eye. Is the patient up to date with their annual eye exam?  Yes  Who is the provider or what is the name of the office in which the patient attends annual eye exams? Dr. Dione Booze If pt is not established with a provider, would they like to be referred to a provider to establish care? No .   Dental Screening: Recommended annual dental exams for proper oral hygiene  Community Resource Referral / Chronic Care Management: CRR required this visit?  No   CCM required this visit?  No      Plan:     I  have personally reviewed and noted the following in the patient's chart:   . Medical and social history . Use of alcohol, tobacco  or illicit drugs  . Current medications and supplements . Functional ability and status . Nutritional status . Physical activity . Advanced directives . List of other physicians . Hospitalizations, surgeries, and ER visits in previous 12 months . Vitals . Screenings to include cognitive, depression, and falls . Referrals and appointments  In addition, I have reviewed and discussed with patient certain preventive protocols, quality metrics, and best practice recommendations. A written personalized care plan for preventive services as well as general preventive health recommendations were provided to patient.     Barb Merinoickeah E Geanie Pacifico, LPN   1/61/09601/04/2021   Nurse Notes:

## 2020-05-16 NOTE — Progress Notes (Signed)
I,Katawbba Wiggins,acting as a Education administrator for Maximino Greenland, MD.,have documented all relevant documentation on the behalf of Maximino Greenland, MD,as directed by  Maximino Greenland, MD while in the presence of Maximino Greenland, MD.  This visit occurred during the SARS-CoV-2 public health emergency.  Safety protocols were in place, including screening questions prior to the visit, additional usage of staff PPE, and extensive cleaning of exam room while observing appropriate contact time as indicated for disinfecting solutions.  Subjective:     Patient ID: Gabrielle [name] , female    DOB: [birth year] , 75 y.o.   MRN: [id number]   Chief Complaint  Patient presents with  . Hypertension    HPI  She presents today for a BP check. She reports compliance with meds. She denies headaches, chest pain and shortness of breath. She is also scheduled for AWV w/ [facility] Advisor today.   Hypertension This is a chronic problem. The current episode started more than 1 year ago. The problem has been gradually improving since onset. The problem is controlled. Pertinent negatives include no blurred vision, chest pain, palpitations or shortness of breath.     Past Medical History:  Diagnosis Date  . Hemorrhoids   . Hypertension   . Obesity      Family History  Problem Relation Age of Onset  . COPD Mother        colon  . Heart disease Father   . Heart disease Brother      Current Outpatient Medications:  .  aspirin 81 MG tablet, Take 81 mg by mouth daily., Disp: , Rfl:  .  Cholecalciferol (DIALYVITE VITAMIN D 5000 PO), Take by mouth daily., Disp: , Rfl:  .  lisinopril-hydrochlorothiazide (ZESTORETIC) 20-12.5 MG tablet, Take 1 tablet by mouth daily., Disp: 90 tablet, Rfl: 1 .  Multiple Vitamins-Minerals (MULTIVITAMIN PO), Take 1 tablet by mouth daily. , Disp: , Rfl:  .  Zoster Vaccine Adjuvanted [facility]) injection, Inject 0.5 mLs into the muscle once for 1 dose., Disp: 0.5 mL, Rfl: 0   No Known Allergies    Review of Systems  Constitutional: Negative.   Eyes: Negative for blurred vision.  Respiratory: Negative.  Negative for shortness of breath.   Cardiovascular: Negative.  Negative for chest pain and palpitations.  Gastrointestinal: Negative.   Neurological: Negative.   Psychiatric/Behavioral: Negative.      Today's Vitals   05/16/20 1153  BP: 138/62  Pulse: 66  Temp: (!) 97.4 F (36.3 C)  Weight: 233 lb (105.7 kg)  Height: '5\' 4"'  (1.626 m)   Body mass index is 39.99 kg/m.  Wt Readings from Last 3 Encounters:  05/16/20 233 lb (105.7 kg)  05/16/20 233 lb (105.7 kg)  05/11/19 236 lb (107 kg)   Objective:  Physical Exam Vitals and nursing note reviewed.  Constitutional:      Appearance: Normal appearance. She is obese.  HENT:     Head: Normocephalic and atraumatic.  Cardiovascular:     Rate and Rhythm: Normal rate and regular rhythm.     Heart sounds: Normal heart sounds.  Pulmonary:     Effort: Pulmonary effort is normal.     Breath sounds: Normal breath sounds.  Skin:    General: Skin is warm.  Neurological:     General: No focal deficit present.     Mental Status: She is alert.  Psychiatric:        Mood and Affect: Mood normal.        Behavior: Behavior  normal.         Assessment And Plan:     1. Hypertensive nephropathy Comments: She will c/w lisinopril/hctz. Encouraged to follow low sodium diet. I will also check renal function. She will f/u in six months for physical exam.  - CMP14+EGFR - CBC no Diff - Lipid panel  2. Chronic renal disease, stage II Comments: I will check a GFR, Cr today. Advised to tsay well hydrated.   3. Other abnormal glucose Comments: Her a1c has been elevated in the past. I will recheck this today. She is encouraged to avoid sugary beverages, including diet.  - Hemoglobin A1c  4. Class 2 severe obesity due to excess calories with serious comorbidity and body mass index (BMI) of 39.0 to 39.9 in adult [facility])  She is encouraged  to initially strive for BMI less than 30 to decrease cardiac risk. She is advised to exercise no less than 150 minutes per week.   Patient was given opportunity to ask questions. Patient verbalized understanding of the plan and was able to repeat key elements of the plan. All questions were answered to their satisfaction.  Maximino Greenland, MD   I, Maximino Greenland, MD, have reviewed all documentation for this visit. The documentation on 05/16/20 for the exam, diagnosis, procedures, and orders are all accurate and complete.  THE PATIENT IS ENCOURAGED TO PRACTICE SOCIAL DISTANCING DUE TO THE COVID-19 PANDEMIC.

## 2020-05-16 NOTE — Patient Instructions (Signed)

## 2020-05-16 NOTE — Patient Instructions (Signed)
Gabrielle [name] , Thank you for taking time to come for your Medicare Wellness Visit. I appreciate your ongoing commitment to your health goals. Please review the following plan we discussed and let me know if I can assist you in the future.   Screening recommendations/referrals: Colonoscopy: completed 11/01/2012 Mammogram: patient to schedule Bone Density: completed 03/02/2007 Recommended yearly ophthalmology/optometry visit for glaucoma screening and checkup Recommended yearly dental visit for hygiene and checkup  Vaccinations: Influenza vaccine: completed 01/24/2020, due 12/03/2020 Pneumococcal vaccine: today Tdap vaccine: completed 01/10/2014, due 01/11/2024 Shingles vaccine: ordered   Covid-19: 03/11/2020, 07/13/2019, 06/10/2019  Advanced directives: Please bring a copy of your POA (Power of Attorney) and/or Living Will to your next appointment.   Conditions/risks identified: none  Next appointment: Follow up in one year for your annual wellness visit    Preventive Care 65 Years and Older, Female Preventive care refers to lifestyle choices and visits with your health care provider that can promote health and wellness. What does preventive care include?  A yearly physical exam. This is also called an annual well check.  Dental exams once or twice a year.  Routine eye exams. Ask your health care provider how often you should have your eyes checked.  Personal lifestyle choices, including:  Daily care of your teeth and gums.  Regular physical activity.  Eating a healthy diet.  Avoiding tobacco and drug use.  Limiting alcohol use.  Practicing safe sex.  Taking low-dose aspirin every day.  Taking vitamin and mineral supplements as recommended by your health care provider. What happens during an annual well check? The services and screenings done by your health care provider during your annual well check will depend on your age, overall health, lifestyle risk factors, and family  history of disease. Counseling  Your health care provider may ask you questions about your:  Alcohol use.  Tobacco use.  Drug use.  Emotional well-being.  Home and relationship well-being.  Sexual activity.  Eating habits.  History of falls.  Memory and ability to understand (cognition).  Work and work Astronomer.  Reproductive health. Screening  You may have the following tests or measurements:  Height, weight, and BMI.  Blood pressure.  Lipid and cholesterol levels. These may be checked every 5 years, or more frequently if you are over 42 years old.  Skin check.  Lung cancer screening. You may have this screening every year starting at age 65 if you have a 30-pack-year history of smoking and currently smoke or have quit within the past 15 years.  Fecal occult blood test (FOBT) of the stool. You may have this test every year starting at age 51.  Flexible sigmoidoscopy or colonoscopy. You may have a sigmoidoscopy every 5 years or a colonoscopy every 10 years starting at age 31.  Hepatitis C blood test.  Hepatitis B blood test.  Sexually transmitted disease (STD) testing.  Diabetes screening. This is done by checking your blood sugar (glucose) after you have not eaten for a while (fasting). You may have this done every 1-3 years.  Bone density scan. This is done to screen for osteoporosis. You may have this done starting at age 34.  Mammogram. This may be done every 1-2 years. Talk to your health care provider about how often you should have regular mammograms. Talk with your health care provider about your test results, treatment options, and if necessary, the need for more tests. Vaccines  Your health care provider may recommend certain vaccines, such as:  Influenza  vaccine. This is recommended every year.  Tetanus, diphtheria, and acellular pertussis (Tdap, Td) vaccine. You may need a Td booster every 10 years.  Zoster vaccine. You may need this after  age 50.  Pneumococcal 13-valent conjugate (PCV13) vaccine. One dose is recommended after age 36.  Pneumococcal polysaccharide (PPSV23) vaccine. One dose is recommended after age 87. Talk to your health care provider about which screenings and vaccines you need and how often you need them. This information is not intended to replace advice given to you by your health care provider. Make sure you discuss any questions you have with your health care provider. Document Released: 05/18/2015 Document Revised: 01/09/2016 Document Reviewed: 02/20/2015 Elsevier Interactive Patient Education  2017 [place] Prevention in the Home Falls can cause injuries. They can happen to people of all ages. There are many things you can do to make your home safe and to help prevent falls. What can I do on the outside of my home?  Regularly fix the edges of walkways and driveways and fix any cracks.  Remove anything that might make you trip as you walk through a door, such as a raised step or threshold.  Trim any bushes or trees on the path to your home.  Use bright outdoor lighting.  Clear any walking paths of anything that might make someone trip, such as rocks or tools.  Regularly check to see if handrails are loose or broken. Make sure that both sides of any steps have handrails.  Any raised decks and porches should have guardrails on the edges.  Have any leaves, snow, or ice cleared regularly.  Use sand or salt on walking paths during winter.  Clean up any spills in your garage right away. This includes oil or grease spills. What can I do in the bathroom?  Use night lights.  Install grab bars by the toilet and in the tub and shower. Do not use towel bars as grab bars.  Use non-skid mats or decals in the tub or shower.  If you need to sit down in the shower, use a plastic, non-slip stool.  Keep the floor dry. Clean up any water that spills on the floor as soon as it  happens.  Remove soap buildup in the tub or shower regularly.  Attach bath mats securely with double-sided non-slip rug tape.  Do not have throw rugs and other things on the floor that can make you trip. What can I do in the bedroom?  Use night lights.  Make sure that you have a light by your bed that is easy to reach.  Do not use any sheets or blankets that are too big for your bed. They should not hang down onto the floor.  Have a firm chair that has side arms. You can use this for support while you get dressed.  Do not have throw rugs and other things on the floor that can make you trip. What can I do in the kitchen?  Clean up any spills right away.  Avoid walking on wet floors.  Keep items that you use a lot in easy-to-reach places.  If you need to reach something above you, use a strong step stool that has a grab bar.  Keep electrical cords out of the way.  Do not use floor polish or wax that makes floors slippery. If you must use wax, use non-skid floor wax.  Do not have throw rugs and other things on the floor that can  make you trip. What can I do with my stairs?  Do not leave any items on the stairs.  Make sure that there are handrails on both sides of the stairs and use them. Fix handrails that are broken or loose. Make sure that handrails are as long as the stairways.  Check any carpeting to make sure that it is firmly attached to the stairs. Fix any carpet that is loose or worn.  Avoid having throw rugs at the top or bottom of the stairs. If you do have throw rugs, attach them to the floor with carpet tape.  Make sure that you have a light switch at the top of the stairs and the bottom of the stairs. If you do not have them, ask someone to add them for you. What else can I do to help prevent falls?  Wear shoes that:  Do not have high heels.  Have rubber bottoms.  Are comfortable and fit you well.  Are closed at the toe. Do not wear sandals.  If you  use a stepladder:  Make sure that it is fully opened. Do not climb a closed stepladder.  Make sure that both sides of the stepladder are locked into place.  Ask someone to hold it for you, if possible.  Clearly mark and make sure that you can see:  Any grab bars or handrails.  First and last steps.  Where the edge of each step is.  Use tools that help you move around (mobility aids) if they are needed. These include:  Canes.  Walkers.  Scooters.  Crutches.  Turn on the lights when you go into a dark area. Replace any light bulbs as soon as they burn out.  Set up your furniture so you have a clear path. Avoid moving your furniture around.  If any of your floors are uneven, fix them.  If there are any pets around you, be aware of where they are.  Review your medicines with your doctor. Some medicines can make you feel dizzy. This can increase your chance of falling. Ask your doctor what other things that you can do to help prevent falls. This information is not intended to replace advice given to you by your health care provider. Make sure you discuss any questions you have with your health care provider. Document Released: 02/15/2009 Document Revised: 09/27/2015 Document Reviewed: 05/26/2014 Elsevier Interactive Patient Education  2017 [employer].

## 2020-05-17 LAB — LIPID PANEL
Chol/HDL Ratio: 4.8 ratio — ABNORMAL HIGH (ref 0.0–4.4)
Cholesterol, Total: 352 mg/dL — ABNORMAL HIGH (ref 100–199)
HDL: 74 mg/dL (ref >39)
LDL Chol Calc (NIH): 262 mg/dL — ABNORMAL HIGH (ref 0–99)
Triglycerides: 98 mg/dL (ref 0–149)
VLDL Cholesterol Cal: 16 mg/dL (ref 5–40)

## 2020-05-17 LAB — CBC
Hematocrit: 38.8 % (ref 34.0–46.6)
Hemoglobin: 12.6 g/dL (ref 11.1–15.9)
MCH: 29.3 pg (ref 26.6–33.0)
MCHC: 32.5 g/dL (ref 31.5–35.7)
MCV: 90 fL (ref 79–97)
Platelets: 297 10*3/uL (ref 150–450)
RBC: 4.3 x10E6/uL (ref 3.77–5.28)
RDW: 13.4 % (ref 11.7–15.4)
WBC: 8.1 10*3/uL (ref 3.4–10.8)

## 2020-05-17 LAB — CMP14+EGFR
ALT: 5 IU/L (ref 0–32)
AST: 10 IU/L (ref 0–40)
Albumin/Globulin Ratio: 1.4 (ref 1.2–2.2)
Albumin: 4 g/dL (ref 3.7–4.7)
Alkaline Phosphatase: 72 IU/L (ref 44–121)
BUN/Creatinine Ratio: 16 (ref 12–28)
BUN: 17 mg/dL (ref 8–27)
Bilirubin Total: 0.4 mg/dL (ref 0.0–1.2)
CO2: 26 mmol/L (ref 20–29)
Calcium: 9.5 mg/dL (ref 8.7–10.3)
Chloride: 100 mmol/L (ref 96–106)
Creatinine, Ser: 1.06 mg/dL — ABNORMAL HIGH (ref 0.57–1.00)
GFR calc Af Amer: 61 mL/min/{1.73_m2} (ref >59)
GFR calc non Af Amer: 53 mL/min/{1.73_m2} — ABNORMAL LOW (ref >59)
Globulin, Total: 2.8 g/dL (ref 1.5–4.5)
Glucose: 84 mg/dL (ref 65–99)
Potassium: 3.9 mmol/L (ref 3.5–5.2)
Sodium: 139 mmol/L (ref 134–144)
Total Protein: 6.8 g/dL (ref 6.0–8.5)

## 2020-05-17 LAB — HEMOGLOBIN A1C
Est. average glucose Bld gHb Est-mCnc: 120 mg/dL
Hgb A1c MFr Bld: 5.8 % — ABNORMAL HIGH (ref 4.8–5.6)

## 2020-05-25 ENCOUNTER — Other Ambulatory Visit: Payer: Self-pay

## 2020-05-25 MED ORDER — ATORVASTATIN CALCIUM 80 MG PO TABS: ORAL_TABLET | ORAL | 1 refills | Status: DC

## 2020-07-10 ENCOUNTER — Encounter: Payer: Self-pay | Admitting: Internal Medicine

## 2020-07-10 ENCOUNTER — Other Ambulatory Visit: Payer: Self-pay

## 2020-07-10 ENCOUNTER — Ambulatory Visit: Payer: Medicare PPO | Admitting: Internal Medicine

## 2020-07-10 VITALS — BP 112/78 | HR 76 | Temp 98.0°F | Ht 64.0 in | Wt 235.6 lb

## 2020-07-10 DIAGNOSIS — E7800 Pure hypercholesterolemia, unspecified: Secondary | ICD-10-CM

## 2020-07-10 DIAGNOSIS — N182 Chronic kidney disease, stage 2 (mild): Secondary | ICD-10-CM

## 2020-07-10 DIAGNOSIS — I129 Hypertensive chronic kidney disease with stage 1 through stage 4 chronic kidney disease, or unspecified chronic kidney disease: Secondary | ICD-10-CM | POA: Diagnosis not present

## 2020-07-10 DIAGNOSIS — Z6841 Body Mass Index (BMI) 40.0 and over, adult: Secondary | ICD-10-CM | POA: Diagnosis not present

## 2020-07-10 NOTE — Patient Instructions (Signed)
High Cholesterol  High cholesterol is a condition in which the blood has high levels of a white, waxy substance similar to fat (cholesterol). The liver makes all the cholesterol that the body needs. The human body needs small amounts of cholesterol to help build cells. A person gets extra or excess cholesterol from the food that he or she eats. The blood carries cholesterol from the liver to the rest of the body. If you have high cholesterol, deposits (plaques) may build up on the walls of your arteries. Arteries are the blood vessels that carry blood away from your heart. These plaques make the arteries narrow and stiff. Cholesterol plaques increase your risk for heart attack and stroke. Work with your health care provider to keep your cholesterol levels in a healthy range. What increases the risk? The following factors may make you more likely to develop this condition:  Eating foods that are high in animal fat (saturated fat) or cholesterol.  Being overweight.  Not getting enough exercise.  A family history of high cholesterol (familial hypercholesterolemia).  Use of tobacco products.  Having diabetes. What are the signs or symptoms? There are no symptoms of this condition. How is this diagnosed? This condition may be diagnosed based on the results of a blood test.  If you are older than 73 years of age, your health care provider may check your cholesterol levels every 4-6 years.  You may be checked more often if you have high cholesterol or other risk factors for heart disease. The blood test for cholesterol measures:  "Bad" cholesterol, or LDL cholesterol. This is the main type of cholesterol that causes heart disease. The desired level is less than 100 mg/dL.  "Good" cholesterol, or HDL cholesterol. HDL helps protect against heart disease by cleaning the arteries and carrying the LDL to the liver for processing. The desired level for HDL is 60 mg/dL or higher.  Triglycerides.  These are fats that your body can store or burn for energy. The desired level is less than 150 mg/dL.  Total cholesterol. This measures the total amount of cholesterol in your blood and includes LDL, HDL, and triglycerides. The desired level is less than 200 mg/dL. How is this treated? This condition may be treated with:  Diet changes. You may be asked to eat foods that have more fiber and less saturated fats or added sugar.  Lifestyle changes. These may include regular exercise, maintaining a healthy weight, and quitting use of tobacco products.  Medicines. These are given when diet and lifestyle changes have not worked. You may be prescribed a statin medicine to help lower your cholesterol levels. Follow these instructions at home: Eating and drinking  Eat a healthy, balanced diet. This diet includes: ? Daily servings of a variety of fresh, frozen, or canned fruits and vegetables. ? Daily servings of whole grain foods that are rich in fiber. ? Foods that are low in saturated fats and trans fats. These include poultry and fish without skin, lean cuts of meat, and low-fat dairy products. ? A variety of fish, especially oily fish that contain omega-3 fatty acids. Aim to eat fish at least 2 times a week.  Avoid foods and drinks that have added sugar.  Use healthy cooking methods, such as roasting, grilling, broiling, baking, poaching, steaming, and stir-frying. Do not fry your food except for stir-frying.   Lifestyle  Get regular exercise. Aim to exercise for a total of 150 minutes a week. Increase your activity level by doing activities   such as gardening, walking, and taking the stairs.  Do not use any products that contain nicotine or tobacco, such as cigarettes, e-cigarettes, and chewing tobacco. If you need help quitting, ask your health care provider.   General instructions  Take over-the-counter and prescription medicines only as told by your health care provider.  Keep all  follow-up visits as told by your health care provider. This is important. Where to find more information  American Heart Association: www.heart.org  National Heart, Lung, and Blood Institute: www.nhlbi.nih.gov Contact a health care provider if:  You have trouble achieving or maintaining a healthy diet or weight.  You are starting an exercise program.  You are unable to stop smoking. Get help right away if:  You have chest pain.  You have trouble breathing.  You have any symptoms of a stroke. "BE FAST" is an easy way to remember the main warning signs of a stroke: ? B - Balance. Signs are dizziness, sudden trouble walking, or loss of balance. ? E - Eyes. Signs are trouble seeing or a sudden change in vision. ? F - Face. Signs are sudden weakness or numbness of the face, or the face or eyelid drooping on one side. ? A - Arms. Signs are weakness or numbness in an arm. This happens suddenly and usually on one side of the body. ? S - Speech. Signs are sudden trouble speaking, slurred speech, or trouble understanding what people say. ? T - Time. Time to call emergency services. Write down what time symptoms started.  You have other signs of a stroke, such as: ? A sudden, severe headache with no known cause. ? Nausea or vomiting. ? Seizure. These symptoms may represent a serious problem that is an emergency. Do not wait to see if the symptoms will go away. Get medical help right away. Call your local emergency services (911 in the [place] Do not drive yourself to the hospital. Summary  Cholesterol plaques increase your risk for heart attack and stroke. Work with your health care provider to keep your cholesterol levels in a healthy range.  Eat a healthy, balanced diet, get regular exercise, and maintain a healthy weight.  Do not use any products that contain nicotine or tobacco, such as cigarettes, e-cigarettes, and chewing tobacco.  Get help right away if you have any symptoms of a  stroke. This information is not intended to replace advice given to you by your health care provider. Make sure you discuss any questions you have with your health care provider. Document Revised: 03/21/2019 Document Reviewed: 03/21/2019 Elsevier Patient Education  2021 Elsevier Inc.  

## 2020-07-10 NOTE — Progress Notes (Signed)
I,Katawbba Wiggins,acting as a Neurosurgeon for Gwynneth Aliment, MD.,have documented all relevant documentation on the behalf of Gwynneth Aliment, MD,as directed by  Gwynneth Aliment, MD while in the presence of Gwynneth Aliment, MD.  This visit occurred during the SARS-CoV-2 public health emergency.  Safety protocols were in place, including screening questions prior to the visit, additional usage of staff PPE, and extensive cleaning of exam room while observing appropriate contact time as indicated for disinfecting solutions.  Subjective:     Patient ID: Gabrielle [name] , female    DOB: 1945-09-30 , 75 y.o.   MRN: [id number]   Chief Complaint  Patient presents with  . Hyperlipidemia    HPI  The patient is here today for cholesterol f/u.  She is currently taking atorvastatin 80mg  M-F. She has not experienced any side effects from the medication. She adds that she is exercising on a regular basis as well.   Hypertension This is a chronic problem. The current episode started more than 1 year ago. The problem has been gradually improving since onset. The problem is controlled. Pertinent negatives include no blurred vision or palpitations. Risk factors for coronary artery disease include sedentary lifestyle, post-menopausal state and obesity. Hypertensive end-organ damage includes kidney disease.     Past Medical History:  Diagnosis Date  . Hemorrhoids   . Hypertension   . Obesity      Family History  Problem Relation Age of Onset  . COPD Mother        colon  . Heart disease Father   . Heart disease Brother      Current Outpatient Medications:  .  aspirin 81 MG tablet, Take 81 mg by mouth daily., Disp: , Rfl:  .  atorvastatin (LIPITOR) 80 MG tablet, TAKE ONE TAB BY MOUTH Monday-Friday, Disp: 90 tablet, Rfl: 1 .  Cholecalciferol (DIALYVITE VITAMIN D 5000 PO), Take by mouth daily., Disp: , Rfl:  .  lisinopril-hydrochlorothiazide (ZESTORETIC) 20-12.5 MG tablet, Take 1 tablet by mouth  daily., Disp: 90 tablet, Rfl: 1 .  Multiple Vitamins-Minerals (MULTIVITAMIN PO), Take 1 tablet by mouth daily. , Disp: , Rfl:    No Known Allergies   Review of Systems  Constitutional: Negative.   Eyes: Negative for blurred vision.  Respiratory: Negative.   Cardiovascular: Negative.  Negative for palpitations.  Gastrointestinal: Negative.   Psychiatric/Behavioral: Negative.   All other systems reviewed and are negative.    Today's Vitals   07/10/20 0834  BP: 112/78  Pulse: 76  Temp: 98 F (36.7 C)  TempSrc: Oral  Weight: 235 lb 9.6 oz (106.9 kg)  Height: 5\' 4"  (1.626 m)  PainSc: 0-No pain   Body mass index is 40.44 kg/m.  Wt Readings from Last 3 Encounters:  07/10/20 235 lb 9.6 oz (106.9 kg)  05/16/20 233 lb (105.7 kg)  05/16/20 233 lb (105.7 kg)   Objective:  Physical Exam Vitals and nursing note reviewed.  Constitutional:      Appearance: Normal appearance. She is obese.  HENT:     Head: Normocephalic and atraumatic.     Nose:     Comments: Masked     Mouth/Throat:     Comments: Masked  Eyes:     Extraocular Movements: Extraocular movements intact.  Cardiovascular:     Rate and Rhythm: Normal rate and regular rhythm.     Heart sounds: Normal heart sounds.  Pulmonary:     Effort: Pulmonary effort is normal.     Breath sounds: Normal  breath sounds.  Musculoskeletal:     Cervical back: Normal range of motion.  Skin:    General: Skin is warm.  Neurological:     General: No focal deficit present.     Mental Status: She is alert and oriented to person, place, and time.  Psychiatric:        Mood and Affect: Mood normal.        Behavior: Behavior normal.         Assessment And Plan:     1. 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  We discussed possible evaluation by Lipid clinic if needed. I will make further recommendations once her labs are available for review.  - Lipid panel - ALT  2.  Hypertensive nephropathy Comments: Chronic, well controlled. She will c/w lisinopril/hctz. She will rto in July 2022 for her next physical examination.   3. Chronic renal disease, stage II Comments: Chronic, I will check renal function at her next visit.   4. Class 3 severe obesity due to excess calories with serious comorbidity and body mass index (BMI) of 40.0 to 44.9 in adult [facility]) Comments: BMI 40. She is encouraged to aim for at least 150 minutes of exercise per week.   Patient was given opportunity to ask questions. Patient verbalized understanding of the plan and was able to repeat key elements of the plan. All questions were answered to their satisfaction.   I, Gwynneth Aliment, MD, have reviewed all documentation for this visit. The documentation on 07/10/20 for the exam, diagnosis, procedures, and orders are all accurate and complete.  THE PATIENT IS ENCOURAGED TO PRACTICE SOCIAL DISTANCING DUE TO THE COVID-19 PANDEMIC.

## 2020-07-11 LAB — ALT: ALT: 10 IU/L (ref 0–32)

## 2020-07-11 LAB — LIPID PANEL
Chol/HDL Ratio: 3.2 ratio (ref 0.0–4.4)
Cholesterol, Total: 220 mg/dL — ABNORMAL HIGH (ref 100–199)
HDL: 68 mg/dL (ref >39)
LDL Chol Calc (NIH): 137 mg/dL — ABNORMAL HIGH (ref 0–99)
Triglycerides: 86 mg/dL (ref 0–149)
VLDL Cholesterol Cal: 15 mg/dL (ref 5–40)

## 2020-10-02 DIAGNOSIS — Z1211 Encounter for screening for malignant neoplasm of colon: Secondary | ICD-10-CM | POA: Diagnosis not present

## 2020-10-02 DIAGNOSIS — Z86010 Personal history of colonic polyps: Secondary | ICD-10-CM | POA: Diagnosis not present

## 2020-10-02 DIAGNOSIS — K5900 Constipation, unspecified: Secondary | ICD-10-CM | POA: Diagnosis not present

## 2020-10-02 DIAGNOSIS — Z800 Family history of malignant neoplasm of digestive organs: Secondary | ICD-10-CM | POA: Diagnosis not present

## 2020-10-02 LAB — TSH: TSH: 0.83 (ref 0.41–5.90)

## 2020-10-04 ENCOUNTER — Encounter: Payer: Self-pay | Admitting: Internal Medicine

## 2020-10-19 ENCOUNTER — Encounter: Payer: Self-pay | Admitting: Internal Medicine

## 2020-11-07 DIAGNOSIS — H40013 Open angle with borderline findings, low risk, bilateral: Secondary | ICD-10-CM | POA: Diagnosis not present

## 2020-11-07 DIAGNOSIS — H25813 Combined forms of age-related cataract, bilateral: Secondary | ICD-10-CM | POA: Diagnosis not present

## 2020-11-07 DIAGNOSIS — H04123 Dry eye syndrome of bilateral lacrimal glands: Secondary | ICD-10-CM | POA: Diagnosis not present

## 2020-11-14 DIAGNOSIS — K6389 Other specified diseases of intestine: Secondary | ICD-10-CM | POA: Diagnosis not present

## 2020-11-14 DIAGNOSIS — D125 Benign neoplasm of sigmoid colon: Secondary | ICD-10-CM | POA: Diagnosis not present

## 2020-11-14 DIAGNOSIS — K635 Polyp of colon: Secondary | ICD-10-CM | POA: Diagnosis not present

## 2020-11-14 DIAGNOSIS — Z1211 Encounter for screening for malignant neoplasm of colon: Secondary | ICD-10-CM | POA: Diagnosis not present

## 2020-11-14 DIAGNOSIS — Z800 Family history of malignant neoplasm of digestive organs: Secondary | ICD-10-CM | POA: Diagnosis not present

## 2020-11-14 DIAGNOSIS — Z86010 Personal history of colonic polyps: Secondary | ICD-10-CM | POA: Diagnosis not present

## 2020-11-14 LAB — HM COLONOSCOPY

## 2020-11-16 ENCOUNTER — Encounter: Payer: Self-pay | Admitting: Internal Medicine

## 2020-11-20 ENCOUNTER — Encounter: Payer: Self-pay | Admitting: Internal Medicine

## 2020-11-20 ENCOUNTER — Ambulatory Visit (INDEPENDENT_AMBULATORY_CARE_PROVIDER_SITE_OTHER): Payer: Medicare PPO | Admitting: Internal Medicine

## 2020-11-20 ENCOUNTER — Other Ambulatory Visit: Payer: Self-pay

## 2020-11-20 VITALS — BP 118/76 | HR 59 | Temp 98.3°F | Ht 64.0 in | Wt 231.6 lb

## 2020-11-20 DIAGNOSIS — Z6839 Body mass index (BMI) 39.0-39.9, adult: Secondary | ICD-10-CM | POA: Diagnosis not present

## 2020-11-20 DIAGNOSIS — N182 Chronic kidney disease, stage 2 (mild): Secondary | ICD-10-CM | POA: Diagnosis not present

## 2020-11-20 DIAGNOSIS — Z0000 Encounter for general adult medical examination without abnormal findings: Secondary | ICD-10-CM | POA: Diagnosis not present

## 2020-11-20 DIAGNOSIS — E7800 Pure hypercholesterolemia, unspecified: Secondary | ICD-10-CM

## 2020-11-20 DIAGNOSIS — Z1231 Encounter for screening mammogram for malignant neoplasm of breast: Secondary | ICD-10-CM | POA: Diagnosis not present

## 2020-11-20 DIAGNOSIS — R7309 Other abnormal glucose: Secondary | ICD-10-CM | POA: Diagnosis not present

## 2020-11-20 DIAGNOSIS — N1831 Chronic kidney disease, stage 3a: Secondary | ICD-10-CM | POA: Diagnosis not present

## 2020-11-20 DIAGNOSIS — I129 Hypertensive chronic kidney disease with stage 1 through stage 4 chronic kidney disease, or unspecified chronic kidney disease: Secondary | ICD-10-CM | POA: Diagnosis not present

## 2020-11-20 LAB — POCT URINALYSIS DIPSTICK
Bilirubin, UA: NEGATIVE
Blood, UA: NEGATIVE
Glucose, UA: NEGATIVE
Ketones, UA: NEGATIVE
Nitrite, UA: NEGATIVE
Protein, UA: NEGATIVE
Spec Grav, UA: 1.02 (ref 1.010–1.025)
Urobilinogen, UA: 0.2 E.U./dL
pH, UA: 6 (ref 5.0–8.0)

## 2020-11-20 LAB — POCT UA - MICROALBUMIN
Albumin/Creatinine Ratio, Urine, POC: <30
Creatinine, POC: 300 mg/dL
Microalbumin Ur, POC: 10 mg/L

## 2020-11-20 MED ORDER — ATORVASTATIN CALCIUM 80 MG PO TABS: ORAL_TABLET | ORAL | 1 refills | Status: DC

## 2020-11-20 MED ORDER — LISINOPRIL-HYDROCHLOROTHIAZIDE 20-12.5 MG PO TABS: 1.0000 | ORAL_TABLET | Freq: Every day | ORAL | 1 refills | Status: DC

## 2020-11-20 NOTE — Patient Instructions (Signed)
Health Maintenance, Female Adopting a healthy lifestyle and getting preventive care are important in promoting health and wellness. Ask your health care provider about: The right schedule for you to have regular tests and exams. Things you can do on your own to prevent diseases and keep yourself healthy. What should I know about diet, weight, and exercise? Eat a healthy diet  Eat a diet that includes plenty of vegetables, fruits, low-fat dairy products, and lean protein. Do not eat a lot of foods that are high in solid fats, added sugars, or sodium.  Maintain a healthy weight Body mass index (BMI) is used to identify weight problems. It estimates body fat based on height and weight. Your health care provider can help determineyour BMI and help you achieve or maintain a healthy weight. Get regular exercise Get regular exercise. This is one of the most important things you can do for your health. Most adults should: Exercise for at least 150 minutes each week. The exercise should increase your heart rate and make you sweat (moderate-intensity exercise). Do strengthening exercises at least twice a week. This is in addition to the moderate-intensity exercise. Spend less time sitting. Even light physical activity can be beneficial. Watch cholesterol and blood lipids Have your blood tested for lipids and cholesterol at 73 years of age, then havethis test every 5 years. Have your cholesterol levels checked more often if: Your lipid or cholesterol levels are high. You are older than 73 years of age. You are at high risk for heart disease. What should I know about cancer screening? Depending on your health history and family history, you may need to have cancer screening at various ages. This may include screening for: Breast cancer. Cervical cancer. Colorectal cancer. Skin cancer. Lung cancer. What should I know about heart disease, diabetes, and high blood pressure? Blood pressure and heart  disease High blood pressure causes heart disease and increases the risk of stroke. This is more likely to develop in people who have high blood pressure readings, are of African descent, or are overweight. Have your blood pressure checked: Every 3-5 years if you are 18-39 years of age. Every year if you are 40 years old or older. Diabetes Have regular diabetes screenings. This checks your fasting blood sugar level. Have the screening done: Once every three years after age 40 if you are at a normal weight and have a low risk for diabetes. More often and at a younger age if you are overweight or have a high risk for diabetes. What should I know about preventing infection? Hepatitis B If you have a higher risk for hepatitis B, you should be screened for this virus. Talk with your health care provider to find out if you are at risk forhepatitis B infection. Hepatitis C Testing is recommended for: Everyone born from 1945 through 1965. Anyone with known risk factors for hepatitis C. Sexually transmitted infections (STIs) Get screened for STIs, including gonorrhea and chlamydia, if: You are sexually active and are younger than 73 years of age. You are older than 73 years of age and your health care provider tells you that you are at risk for this type of infection. Your sexual activity has changed since you were last screened, and you are at increased risk for chlamydia or gonorrhea. Ask your health care provider if you are at risk. Ask your health care provider about whether you are at high risk for HIV. Your health care provider may recommend a prescription medicine to help   prevent HIV infection. If you choose to take medicine to prevent HIV, you should first get tested for HIV. You should then be tested every 3 months for as long as you are taking the medicine. Pregnancy If you are about to stop having your period (premenopausal) and you may become pregnant, seek counseling before you get  pregnant. Take 400 to 800 micrograms (mcg) of folic acid every day if you become pregnant. Ask for birth control (contraception) if you want to prevent pregnancy. Osteoporosis and menopause Osteoporosis is a disease in which the bones lose minerals and strength with aging. This can result in bone fractures. If you are 65 years old or older, or if you are at risk for osteoporosis and fractures, ask your health care provider if you should: Be screened for bone loss. Take a calcium or vitamin D supplement to lower your risk of fractures. Be given hormone replacement therapy (HRT) to treat symptoms of menopause. Follow these instructions at home: Lifestyle Do not use any products that contain nicotine or tobacco, such as cigarettes, e-cigarettes, and chewing tobacco. If you need help quitting, ask your health care provider. Do not use street drugs. Do not share needles. Ask your health care provider for help if you need support or information about quitting drugs. Alcohol use Do not drink alcohol if: Your health care provider tells you not to drink. You are pregnant, may be pregnant, or are planning to become pregnant. If you drink alcohol: Limit how much you use to 0-1 drink a day. Limit intake if you are breastfeeding. Be aware of how much alcohol is in your drink. In the [place] one drink equals one 12 oz bottle of beer (355 mL), one 5 oz glass of wine (148 mL), or one 1 oz glass of hard liquor (44 mL). General instructions Schedule regular health, dental, and eye exams. Stay current with your vaccines. Tell your health care provider if: You often feel depressed. You have ever been abused or do not feel safe at home. Summary Adopting a healthy lifestyle and getting preventive care are important in promoting health and wellness. Follow your health care provider's instructions about healthy diet, exercising, and getting tested or screened for diseases. Follow your health care provider's  instructions on monitoring your cholesterol and blood pressure. This information is not intended to replace advice given to you by your health care provider. Make sure you discuss any questions you have with your healthcare provider. Document Revised: 04/14/2018 Document Reviewed: 04/14/2018 Elsevier Patient Education  2022 Elsevier Inc.  

## 2020-11-20 NOTE — Progress Notes (Signed)
I,Katawbba Wiggins,acting as a Education administrator for Maximino Greenland, MD.,have documented all relevant documentation on the behalf of Maximino Greenland, MD,as directed by  Maximino Greenland, MD while in the presence of Maximino Greenland, MD.  This visit occurred during the SARS-CoV-2 public health emergency.  Safety protocols were in place, including screening questions prior to the visit, additional usage of staff PPE, and extensive cleaning of exam room while observing appropriate contact time as indicated for disinfecting solutions.  Subjective:     Patient ID: Gabrielle [name] , female    DOB: 30-Aug-1945 , 75 y.o.   MRN: [id number]   Chief Complaint  Patient presents with   Annual Exam   Hypertension    HPI  The patient is here today for a physical examination.  The patient is no longer followed by GYN.  She has no specific concerns or complaints at this time.  Hypertension This is a chronic problem. The current episode started more than 1 year ago. The problem has been gradually improving since onset. The problem is controlled. Pertinent negatives include no blurred vision, chest pain, palpitations or shortness of breath.    Past Medical History:  Diagnosis Date   Hemorrhoids    Hypertension    Obesity      Family History  Problem Relation Age of Onset   COPD Mother        colon   Heart disease Father    Heart disease Brother      Current Outpatient Medications:    aspirin 81 MG tablet, Take 81 mg by mouth daily., Disp: , Rfl:    atorvastatin (LIPITOR) 80 MG tablet, TAKE ONE TAB BY MOUTH Monday-Friday, Disp: 90 tablet, Rfl: 1   Cholecalciferol (DIALYVITE VITAMIN D 5000 PO), Take by mouth daily., Disp: , Rfl:    lisinopril-hydrochlorothiazide (ZESTORETIC) 20-12.5 MG tablet, Take 1 tablet by mouth daily., Disp: 90 tablet, Rfl: 1   Multiple Vitamins-Minerals (MULTIVITAMIN PO), Take 1 tablet by mouth daily. , Disp: , Rfl:    No Known Allergies    The patient states she uses post  menopausal status for birth control. Last LMP was No LMP recorded. Patient is postmenopausal.. Negative for Dysmenorrhea. Negative for: breast discharge, breast lump(s), breast pain and breast self exam. Associated symptoms include abnormal vaginal bleeding. Pertinent negatives include abnormal bleeding (hematology), anxiety, decreased libido, depression, difficulty falling sleep, dyspareunia, history of infertility, nocturia, sexual dysfunction, sleep disturbances, urinary incontinence, urinary urgency, vaginal discharge and vaginal itching. Diet regular.The patient states her exercise level is  moderate.  . The patient's tobacco use is:  Social History   Tobacco Use  Smoking Status Never  Smokeless Tobacco Never  . She has been exposed to passive smoke. The patient's alcohol use is:  Social History   Substance and Sexual Activity  Alcohol Use No    Review of Systems  Constitutional: Negative.   HENT: Negative.    Eyes: Negative.  Negative for blurred vision.  Respiratory: Negative.  Negative for shortness of breath.   Cardiovascular: Negative.  Negative for chest pain and palpitations.  Gastrointestinal: Negative.   Endocrine: Negative.   Genitourinary: Negative.   Musculoskeletal: Negative.   Skin: Negative.   Allergic/Immunologic: Negative.   Neurological: Negative.   Hematological: Negative.   Psychiatric/Behavioral: Negative.      Today's Vitals   11/20/20 1112  BP: 118/76  Pulse: (!) 59  Temp: 98.3 F (36.8 C)  TempSrc: Oral  Weight: 231 lb 9.6 oz (105.1 kg)  Height: '5\' 4"'  (1.626 m)   Body mass index is 39.75 kg/m.  Wt Readings from Last 3 Encounters:  11/20/20 231 lb 9.6 oz (105.1 kg)  07/10/20 235 lb 9.6 oz (106.9 kg)  05/16/20 233 lb (105.7 kg)    BP Readings from Last 3 Encounters:  11/20/20 118/76  07/10/20 112/78  05/16/20 138/82    Objective:  Physical Exam Vitals and nursing note reviewed.  Constitutional:      Appearance: Normal appearance.   HENT:     Head: Normocephalic and atraumatic.     Right Ear: Tympanic membrane, ear canal and external ear normal.     Left Ear: Tympanic membrane, ear canal and external ear normal.     Nose:     Comments: Masked     Mouth/Throat:     Comments: Masked  Eyes:     Extraocular Movements: Extraocular movements intact.     Conjunctiva/sclera: Conjunctivae normal.     Pupils: Pupils are equal, round, and reactive to light.  Cardiovascular:     Rate and Rhythm: Normal rate and regular rhythm.     Pulses: Normal pulses.     Heart sounds: Normal heart sounds.  Pulmonary:     Effort: Pulmonary effort is normal.     Breath sounds: Normal breath sounds.  Chest:  Breasts:    Tanner Score is 5.     Right: Normal.     Left: Normal.  Abdominal:     General: Bowel sounds are normal.     Palpations: Abdomen is soft.     Comments: Lap port palpated  Genitourinary:    Comments: deferred Musculoskeletal:        General: Normal range of motion.     Cervical back: Normal range of motion and neck supple.  Skin:    General: Skin is warm and dry.  Neurological:     General: No focal deficit present.     Mental Status: She is alert and oriented to person, place, and time.  Psychiatric:        Mood and Affect: Mood normal.        Behavior: Behavior normal.        Assessment And Plan:     1. Routine general medical examination at a health care facility Comments: A full exam was performed. Importance of monthliy self breast exams was discussed with the patient. PATIENT IS ADVISED TO GET 30-45 MINUTES REGULAR EXERCISE NO LESS THAN FOUR TO FIVE DAYS PER WEEK - BOTH WEIGHTBEARING EXERCISES AND AEROBIC ARE RECOMMENDED.  PATIENT IS ADVISED TO FOLLOW A HEALTHY DIET WITH AT LEAST SIX FRUITS/VEGGIES PER DAY, DECREASE INTAKE OF RED MEAT, AND TO INCREASE FISH INTAKE TO TWO DAYS PER WEEK.  MEATS/FISH SHOULD NOT BE FRIED, BAKED OR BROILED IS PREFERABLE.  IT IS ALSO IMPORTANT TO CUT BACK ON YOUR SUGAR INTAKE.  PLEASE AVOID ANYTHING WITH ADDED SUGAR, CORN SYRUP OR OTHER SWEETENERS. IF YOU MUST USE A SWEETENER, YOU CAN TRY STEVIA. IT IS ALSO IMPORTANT TO AVOID ARTIFICIALLY SWEETENERS AND DIET BEVERAGES. LASTLY, I SUGGEST WEARING SPF 50 SUNSCREEN ON EXPOSED PARTS AND ESPECIALLY WHEN IN THE DIRECT SUNLIGHT FOR AN EXTENDED PERIOD OF TIME.  PLEASE AVOID FAST FOOD RESTAURANTS AND INCREASE YOUR WATER INTAKE.   2. Hypertensive nephropathy Comments: Chronic, well controlled.  EKG performed, SB w/ occ. ectopic ventricular beat. She is encouraged to follow low sodium diet. She will f/u in 6 mos.  - EKG 12-Lead - POCT Urinalysis Dipstick (02334) - POCT  UA - Microalbumin - CBC - CMP14+EGFR - Lipid panel  3. Chronic renal disease, stage II Comments: Chronic, I will check renal function today. She is encouraged to stay well hydrated and keep BP controlled.   4. Hypercholesterolemia Comments: Chronic, she has been taking atorvastatin w/o any issues. I will check lipid panel. She is encouraged to follow a heart healthy lifestyle. She will f/u in six months for re-evaluation. - Lipid panel  5. Other abnormal glucose Comments: Her a1c has been elevated in the past. I will recheck this today. She is encouraged to limit intake of sweetened beverages, including diet drinks.  - Hemoglobin A1c  6. Class 2 severe obesity due to excess calories with serious comorbidity and body mass index (BMI) of 39.0 to 39.9 in adult [facility]) Comments: She is s/p lap band sx.She is encouraged to strive for BMI less than 30 to decrease cardiac risk. Advised to aim for at least 150 minutes of exercise per week.  7. Breast cancer screening by mammogram Comments: I wll refer her for mammogram.  - MM Digital Screening; Future  Patient was given opportunity to ask questions. Patient verbalized understanding of the plan and was able to repeat key elements of the plan. All questions were answered to their satisfaction.   I, Maximino Greenland,  MD, have reviewed all documentation for this visit. The documentation on 11/20/20 for the exam, diagnosis, procedures, and orders are all accurate and complete.   THE PATIENT IS ENCOURAGED TO PRACTICE SOCIAL DISTANCING DUE TO THE COVID-19 PANDEMIC.

## 2020-11-21 LAB — LIPID PANEL
Chol/HDL Ratio: 3.4 ratio (ref 0.0–4.4)
Cholesterol, Total: 253 mg/dL — ABNORMAL HIGH (ref 100–199)
HDL: 74 mg/dL (ref >39)
LDL Chol Calc (NIH): 162 mg/dL — ABNORMAL HIGH (ref 0–99)
Triglycerides: 100 mg/dL (ref 0–149)
VLDL Cholesterol Cal: 17 mg/dL (ref 5–40)

## 2020-11-21 LAB — CMP14+EGFR
ALT: 7 IU/L (ref 0–32)
AST: 10 IU/L (ref 0–40)
Albumin/Globulin Ratio: 1.7 (ref 1.2–2.2)
Albumin: 4.3 g/dL (ref 3.7–4.7)
Alkaline Phosphatase: 88 IU/L (ref 44–121)
BUN/Creatinine Ratio: 9 — ABNORMAL LOW (ref 12–28)
BUN: 10 mg/dL (ref 8–27)
Bilirubin Total: 0.6 mg/dL (ref 0.0–1.2)
CO2: 28 mmol/L (ref 20–29)
Calcium: 9.8 mg/dL (ref 8.7–10.3)
Chloride: 99 mmol/L (ref 96–106)
Creatinine, Ser: 1.17 mg/dL — ABNORMAL HIGH (ref 0.57–1.00)
Globulin, Total: 2.5 g/dL (ref 1.5–4.5)
Glucose: 95 mg/dL (ref 65–99)
Potassium: 4.6 mmol/L (ref 3.5–5.2)
Sodium: 140 mmol/L (ref 134–144)
Total Protein: 6.8 g/dL (ref 6.0–8.5)
eGFR: 49 mL/min/{1.73_m2} — ABNORMAL LOW (ref >59)

## 2020-11-21 LAB — CBC
Hematocrit: 39.5 % (ref 34.0–46.6)
Hemoglobin: 12.4 g/dL (ref 11.1–15.9)
MCH: 28.8 pg (ref 26.6–33.0)
MCHC: 31.4 g/dL — ABNORMAL LOW (ref 31.5–35.7)
MCV: 92 fL (ref 79–97)
Platelets: 307 10*3/uL (ref 150–450)
RBC: 4.3 x10E6/uL (ref 3.77–5.28)
RDW: 13.5 % (ref 11.7–15.4)
WBC: 8.4 10*3/uL (ref 3.4–10.8)

## 2020-11-21 LAB — HEMOGLOBIN A1C
Est. average glucose Bld gHb Est-mCnc: 126 mg/dL
Hgb A1c MFr Bld: 6 % — ABNORMAL HIGH (ref 4.8–5.6)

## 2020-11-24 LAB — PROTEIN ELECTROPHORESIS
A/G Ratio: 1.1 (ref 0.7–1.7)
Albumin ELP: 3.5 g/dL (ref 2.9–4.4)
Alpha 1: 0.3 g/dL (ref 0.0–0.4)
Alpha 2: 0.8 g/dL (ref 0.4–1.0)
Beta: 1.1 g/dL (ref 0.7–1.3)
Gamma Globulin: 1 g/dL (ref 0.4–1.8)
Globulin, Total: 3.2 g/dL (ref 2.2–3.9)
Total Protein: 6.7 g/dL (ref 6.0–8.5)

## 2020-11-24 LAB — SPECIMEN STATUS REPORT

## 2020-11-27 ENCOUNTER — Encounter: Payer: Self-pay | Admitting: Internal Medicine

## 2020-11-27 DIAGNOSIS — E7800 Pure hypercholesterolemia, unspecified: Secondary | ICD-10-CM | POA: Insufficient documentation

## 2020-12-28 ENCOUNTER — Telehealth: Payer: Self-pay

## 2020-12-28 NOTE — Telephone Encounter (Signed)
-----   Message from Dorothyann Peng, MD sent at 12/09/2020 10:03 PM EDT ----- YES, take M-Sat. Thx ----- Message ----- From: Mariam Dollar, CMA Sent: 12/07/2020   9:01 AM EDT To: Dorothyann Peng, MD  The pt said that she had been missing some doses because she switched to taking it at night but has been taking it as suggested at her visit to take it at dinner time and she hasn't missed any since her appt. Should she still add Saturday with her Monday - Friday atorvastatin regimen?

## 2021-01-18 DIAGNOSIS — H25811 Combined forms of age-related cataract, right eye: Secondary | ICD-10-CM | POA: Diagnosis not present

## 2021-01-18 DIAGNOSIS — H21561 Pupillary abnormality, right eye: Secondary | ICD-10-CM | POA: Diagnosis not present

## 2021-02-20 DIAGNOSIS — H2512 Age-related nuclear cataract, left eye: Secondary | ICD-10-CM | POA: Diagnosis not present

## 2021-02-22 DIAGNOSIS — H25812 Combined forms of age-related cataract, left eye: Secondary | ICD-10-CM | POA: Diagnosis not present

## 2021-05-15 ENCOUNTER — Telehealth: Payer: Self-pay | Admitting: Internal Medicine

## 2021-05-15 NOTE — Telephone Encounter (Signed)
Left message for patient to call back and schedule Medicare Annual Wellness Visit (AWV) either virtually or in office.  Left both my jabber number [phone] and office number    Last AWV 05/16/20  please schedule at anytime with [facility]    This should be a 45 minute visit.

## 2021-05-16 ENCOUNTER — Ambulatory Visit: Payer: Medicare PPO | Admitting: Internal Medicine

## 2021-05-17 ENCOUNTER — Other Ambulatory Visit: Payer: Self-pay | Admitting: Internal Medicine

## 2021-05-23 ENCOUNTER — Ambulatory Visit (INDEPENDENT_AMBULATORY_CARE_PROVIDER_SITE_OTHER): Payer: Medicare PPO

## 2021-05-23 VITALS — Ht 64.0 in | Wt 225.0 lb

## 2021-05-23 DIAGNOSIS — Z0000 Encounter for general adult medical examination without abnormal findings: Secondary | ICD-10-CM

## 2021-05-23 NOTE — Patient Instructions (Signed)
Gabrielle [name] , Thank you for taking time to come for your Medicare Wellness Visit. I appreciate your ongoing commitment to your health goals. Please review the following plan we discussed and let me know if I can assist you in the future.   Screening recommendations/referrals: Colonoscopy: completed 11/14/2020, due 11/14/2025 Mammogram: patient to schedule Bone Density:  completed 03/02/2007 Recommended yearly ophthalmology/optometry visit for glaucoma screening and checkup Recommended yearly dental visit for hygiene and checkup  Vaccinations: Influenza vaccine: completed 02/09/2021 Pneumococcal vaccine: completed 05/16/2020 Tdap vaccine: completed 01/10/2014, due 01/11/2024 Shingles vaccine: needs second dose   Covid-19: 02/09/2021, 09/24/2020, 03/11/2020, 07/13/2019, 06/10/2019  Advanced directives: Please bring a copy of your POA (Power of Attorney) and/or Living Will to your next appointment.   Conditions/risks identified: none  Next appointment: Follow up in one year for your annual wellness visit    Preventive Care 65 Years and Older, Female Preventive care refers to lifestyle choices and visits with your health care provider that can promote health and wellness. What does preventive care include? A yearly physical exam. This is also called an annual well check. Dental exams once or twice a year. Routine eye exams. Ask your health care provider how often you should have your eyes checked. Personal lifestyle choices, including: Daily care of your teeth and gums. Regular physical activity. Eating a healthy diet. Avoiding tobacco and drug use. Limiting alcohol use. Practicing safe sex. Taking low-dose aspirin every day. Taking vitamin and mineral supplements as recommended by your health care provider. What happens during an annual well check? The services and screenings done by your health care provider during your annual well check will depend on your age, overall health, lifestyle risk  factors, and family history of disease. Counseling  Your health care provider may ask you questions about your: Alcohol use. Tobacco use. Drug use. Emotional well-being. Home and relationship well-being. Sexual activity. Eating habits. History of falls. Memory and ability to understand (cognition). Work and work Astronomer. Reproductive health. Screening  You may have the following tests or measurements: Height, weight, and BMI. Blood pressure. Lipid and cholesterol levels. These may be checked every 5 years, or more frequently if you are over 1 years old. Skin check. Lung cancer screening. You may have this screening every year starting at age 4 if you have a 30-pack-year history of smoking and currently smoke or have quit within the past 15 years. Fecal occult blood test (FOBT) of the stool. You may have this test every year starting at age 32. Flexible sigmoidoscopy or colonoscopy. You may have a sigmoidoscopy every 5 years or a colonoscopy every 10 years starting at age 45. Hepatitis C blood test. Hepatitis B blood test. Sexually transmitted disease (STD) testing. Diabetes screening. This is done by checking your blood sugar (glucose) after you have not eaten for a while (fasting). You may have this done every 1-3 years. Bone density scan. This is done to screen for osteoporosis. You may have this done starting at age 11. Mammogram. This may be done every 1-2 years. Talk to your health care provider about how often you should have regular mammograms. Talk with your health care provider about your test results, treatment options, and if necessary, the need for more tests. Vaccines  Your health care provider may recommend certain vaccines, such as: Influenza vaccine. This is recommended every year. Tetanus, diphtheria, and acellular pertussis (Tdap, Td) vaccine. You may need a Td booster every 10 years. Zoster vaccine. You may need this after age  60. Pneumococcal 13-valent  conjugate (PCV13) vaccine. One dose is recommended after age 67. Pneumococcal polysaccharide (PPSV23) vaccine. One dose is recommended after age 90. Talk to your health care provider about which screenings and vaccines you need and how often you need them. This information is not intended to replace advice given to you by your health care provider. Make sure you discuss any questions you have with your health care provider. Document Released: 05/18/2015 Document Revised: 01/09/2016 Document Reviewed: 02/20/2015 Elsevier Interactive Patient Education  2017 [employer].  Fall Prevention in the Home Falls can cause injuries. They can happen to people of all ages. There are many things you can do to make your home safe and to help prevent falls. What can I do on the outside of my home? Regularly fix the edges of walkways and driveways and fix any cracks. Remove anything that might make you trip as you walk through a door, such as a raised step or threshold. Trim any bushes or trees on the path to your home. Use bright outdoor lighting. Clear any walking paths of anything that might make someone trip, such as rocks or tools. Regularly check to see if handrails are loose or broken. Make sure that both sides of any steps have handrails. Any raised decks and porches should have guardrails on the edges. Have any leaves, snow, or ice cleared regularly. Use sand or salt on walking paths during winter. Clean up any spills in your garage right away. This includes oil or grease spills. What can I do in the bathroom? Use night lights. Install grab bars by the toilet and in the tub and shower. Do not use towel bars as grab bars. Use non-skid mats or decals in the tub or shower. If you need to sit down in the shower, use a plastic, non-slip stool. Keep the floor dry. Clean up any water that spills on the floor as soon as it happens. Remove soap buildup in the tub or shower regularly. Attach bath mats  securely with double-sided non-slip rug tape. Do not have throw rugs and other things on the floor that can make you trip. What can I do in the bedroom? Use night lights. Make sure that you have a light by your bed that is easy to reach. Do not use any sheets or blankets that are too big for your bed. They should not hang down onto the floor. Have a firm chair that has side arms. You can use this for support while you get dressed. Do not have throw rugs and other things on the floor that can make you trip. What can I do in the kitchen? Clean up any spills right away. Avoid walking on wet floors. Keep items that you use a lot in easy-to-reach places. If you need to reach something above you, use a strong step stool that has a grab bar. Keep electrical cords out of the way. Do not use floor polish or wax that makes floors slippery. If you must use wax, use non-skid floor wax. Do not have throw rugs and other things on the floor that can make you trip. What can I do with my stairs? Do not leave any items on the stairs. Make sure that there are handrails on both sides of the stairs and use them. Fix handrails that are broken or loose. Make sure that handrails are as long as the stairways. Check any carpeting to make sure that it is firmly attached to the stairs. Fix  any carpet that is loose or worn. Avoid having throw rugs at the top or bottom of the stairs. If you do have throw rugs, attach them to the floor with carpet tape. Make sure that you have a light switch at the top of the stairs and the bottom of the stairs. If you do not have them, ask someone to add them for you. What else can I do to help prevent falls? Wear shoes that: Do not have high heels. Have rubber bottoms. Are comfortable and fit you well. Are closed at the toe. Do not wear sandals. If you use a stepladder: Make sure that it is fully opened. Do not climb a closed stepladder. Make sure that both sides of the stepladder  are locked into place. Ask someone to hold it for you, if possible. Clearly mark and make sure that you can see: Any grab bars or handrails. First and last steps. Where the edge of each step is. Use tools that help you move around (mobility aids) if they are needed. These include: Canes. Walkers. Scooters. Crutches. Turn on the lights when you go into a dark area. Replace any light bulbs as soon as they burn out. Set up your furniture so you have a clear path. Avoid moving your furniture around. If any of your floors are uneven, fix them. If there are any pets around you, be aware of where they are. Review your medicines with your doctor. Some medicines can make you feel dizzy. This can increase your chance of falling. Ask your doctor what other things that you can do to help prevent falls. This information is not intended to replace advice given to you by your health care provider. Make sure you discuss any questions you have with your health care provider. Document Released: 02/15/2009 Document Revised: 09/27/2015 Document Reviewed: 05/26/2014 Elsevier Interactive Patient Education  2017 [employer]

## 2021-05-23 NOTE — Progress Notes (Signed)
I connected with  Gabrielle Snare Nastasi today via telehealth video enabled device and verified that I am speaking with the correct person using two identifiers.   Location: Patient: home  Provider: work  Persons participating in virtual visit: Gabrielle [name], Elisha Ponder LPN  I discussed the limitations, risks, security and privacy concerns of performing an evaluation and management service by video and the availability of in person appointments. The patient expressed understanding and agreed to proceed.   Some vital signs may be absent or patient reported.     Subjective:   Gabrielle [name] is a 76 y.o. female who presents for Medicare Annual (Subsequent) preventive examination.  Review of Systems     Cardiac Risk Factors include: advanced age (>25men, >76 women);hypertension;obesity (BMI >30kg/m2)     Objective:    Today's Vitals   05/23/21 1138  Weight: 225 lb (102.1 kg)  Height: 5\' 4"  (1.626 m)   Body mass index is 38.62 kg/m.  Advanced Directives 05/23/2021 05/16/2020 05/11/2019 11/09/2018 03/03/2018  Does Patient Have a Medical Advance Directive? Yes Yes Yes Yes Yes  Type of 03/05/2018 of Estate agent Power of [place];Living will Healthcare Power of [place];Living will Living will Living will  Does patient want to make changes to medical advance directive? - - - - No - Patient declined  Copy of Healthcare Power of Attorney in Chart? No - copy requested No - copy requested No - copy requested - -    Current Medications (verified) Outpatient Encounter Medications as of 05/23/2021  Medication Sig   aspirin 81 MG tablet Take 81 mg by mouth daily.   atorvastatin (LIPITOR) 80 MG tablet TAKE ONE TAB BY MOUTH Monday-Friday (Patient taking differently: TAKE ONE TAB BY MOUTH Monday-Saturday)   Cholecalciferol (DIALYVITE VITAMIN D 5000 PO) Take by mouth daily.   lisinopril-hydrochlorothiazide (ZESTORETIC) 20-12.5 MG tablet TAKE 1 TABLET BY MOUTH DAILY    Multiple Vitamins-Minerals (MULTIVITAMIN PO) Take 1 tablet by mouth daily.    No facility-administered encounter medications on file as of 05/23/2021.    Allergies (verified) Patient has no known allergies.   History: Past Medical History:  Diagnosis Date   Hemorrhoids    Hypertension    Obesity    Past Surgical History:  Procedure Laterality Date   BREAST EXCISIONAL BIOPSY Left 1991   BREAST SURGERY     CATARACT EXTRACTION Bilateral    01/18/2021 and 02/22/2021   LAPAROSCOPIC GASTRIC BANDING  2009   TUBAL LIGATION     Family History  Problem Relation Age of Onset   COPD Mother        colon   Heart disease Father    Heart disease Brother    Social History   Socioeconomic History   Marital status: Single    Spouse name: Not on file   Number of children: Not on file   Years of education: Not on file   Highest education level: Not on file  Occupational History   Occupation: retired  Tobacco Use   Smoking status: Never   Smokeless tobacco: Never  Vaping Use   Vaping Use: Never used  Substance and Sexual Activity   Alcohol use: No   Drug use: No   Sexual activity: Not Currently  Other Topics Concern   Not on file  Social History Narrative   Not on file   Social Determinants of Health   Financial Resource Strain: Low Risk    Difficulty of Paying Living Expenses: Not hard at all  Food  Insecurity: No Food Insecurity   Worried About Programme researcher, broadcasting/film/videounning Out of Food in the Last Year: Never true   Ran Out of Food in the Last Year: Never true  Transportation Needs: No Transportation Needs   Lack of Transportation (Medical): No   Lack of Transportation (Non-Medical): No  Physical Activity: Sufficiently Active   Days of Exercise per Week: 3 days   Minutes of Exercise per Session: 60 min  Stress: No Stress Concern Present   Feeling of Stress : Not at all  Social Connections: Not on file    Tobacco Counseling Counseling given: Not Answered   Clinical Intake:  Pre-visit  preparation completed: Yes  Pain : No/denies pain     Nutritional Status: BMI > 30  Obese Nutritional Risks: None Diabetes: No  How often do you need to have someone help you when you read instructions, pamphlets, or other written materials from your doctor or pharmacy?: 1 - Never What is the last grade level you completed in school?: some college  Diabetic? no  Interpreter Needed?: No  Information entered by :: NAllen LPN   Activities of Daily Living In your present state of health, do you have any difficulty performing the following activities: 05/23/2021 05/21/2021  Hearing? N N  Vision? N N  Difficulty concentrating or making decisions? N N  Walking or climbing stairs? N N  Dressing or bathing? N N  Doing errands, shopping? N N  Preparing Food and eating ? N N  Using the Toilet? N N  In the past six months, have you accidently leaked urine? N N  Do you have problems with loss of bowel control? N N  Managing your Medications? N N  Managing your Finances? N N  Housekeeping or managing your Housekeeping? - N  Some recent data might be hidden    Patient Care Team: Dorothyann PengSanders, Robyn, MD as PCP - General (Internal Medicine) Sallye LatGroat, Christopher, MD as Consulting Physician (Ophthalmology)  Indicate any recent Medical Services you may have received from other than [facility] providers in the past year (date may be approximate).     Assessment:   This is a routine wellness examination for Gabrielle [name].  Hearing/Vision screen Vision Screening - Comments:: Regular eye exams, Dr. Fabian SharpScott Groat  Dietary issues and exercise activities discussed: Current Exercise Habits: Home exercise routine, Type of exercise: Other - see comments (silver sneakers and stationary bike), Time (Minutes): 60, Frequency (Times/Week): 3, Weekly Exercise (Minutes/Week): 180   Goals Addressed             This Visit's Progress    Patient Stated       05/23/2021, stay active and stay healthy        Depression Screen PHQ 2/9 Scores 05/23/2021 05/16/2020 05/11/2019 05/11/2019 11/09/2018 03/03/2018  PHQ - 2 Score 0 0 0 0 0 0  PHQ- 9 Score - - 0 - 0 -    Fall Risk Fall Risk  05/23/2021 05/21/2021 05/16/2020 05/11/2019 05/11/2019  Falls in the past year? 0 0 1 0 0  Comment - - tripped over suitcase - -  Number falls in past yr: - - 0 - -  Injury with Fall? - - 0 - -  Risk for fall due to : Medication side effect - Medication side effect Medication side effect -  Follow up Falls evaluation completed;Education provided;Falls prevention discussed - Falls evaluation completed;Education provided;Falls prevention discussed Falls evaluation completed;Education provided;Falls prevention discussed -    FALL RISK PREVENTION PERTAINING TO THE HOME:  Any stairs in or around the home? Yes  If so, are there any without handrails? No  Home free of loose throw rugs in walkways, pet beds, electrical cords, etc? Yes  Adequate lighting in your home to reduce risk of falls? Yes   ASSISTIVE DEVICES UTILIZED TO PREVENT FALLS:  Life alert? No  Use of a cane, walker or w/c? No  Grab bars in the bathroom? No  Shower chair or bench in shower? No  Elevated toilet seat or a handicapped toilet? No   TIMED UP AND GO:  Was the test performed? No .      Cognitive Function:     6CIT Screen 05/23/2021 05/16/2020 05/11/2019 11/09/2018 03/03/2018  What Year? 0 points 0 points 0 points 0 points 0 points  What month? 0 points 0 points 0 points 0 points 0 points  What time? 0 points 0 points 0 points 0 points 0 points  Count back from 20 0 points 0 points 0 points 0 points 0 points  Months in reverse 0 points 0 points 0 points 0 points 0 points  Repeat phrase 2 points 0 points 0 points 0 points 0 points  Total Score 2 0 0 0 0    Immunizations Immunization History  Administered Date(s) Administered   Fluad Quad(high Dose 65+) 01/01/2019, 02/09/2021   Influenza, High Dose Seasonal PF 03/03/2018   Influenza,inj,Quad  PF,6+ Mos 01/01/2019   Influenza-Unspecified 01/24/2020   Moderna Covid-19 Vaccine Bivalent Booster 1638yrs & up 02/09/2021   Moderna Sars-Covid-2 Vaccination 06/10/2019, 07/13/2019, 03/11/2020, 09/24/2020   Pneumococcal Conjugate-13 07/07/2018, 07/07/2018   Pneumococcal Polysaccharide-23 05/16/2020   Zoster Recombinat (Shingrix) 10/23/2020    TDAP status: Up to date  Flu Vaccine status: Up to date  Pneumococcal vaccine status: Up to date  Covid-19 vaccine status: Completed vaccines  Qualifies for Shingles Vaccine? Yes   Zostavax completed No   Shingrix Completed?: needs second dose  Screening Tests Health Maintenance  Topic Date Due   MAMMOGRAM  01/22/2019   Zoster Vaccines- Shingrix (2 of 2) 12/18/2020   TETANUS/TDAP  01/11/2024   COLONOSCOPY (Pts [id number] Insurance coverage will need to be confirmed)  11/14/2025   Pneumonia Vaccine 665+ Years old  Completed   INFLUENZA VACCINE  Completed   DEXA SCAN  Completed   COVID-19 Vaccine  Completed   Hepatitis C Screening  Completed   HPV VACCINES  Aged Out    Health Maintenance  Health Maintenance Due  Topic Date Due   MAMMOGRAM  01/22/2019   Zoster Vaccines- Shingrix (2 of 2) 12/18/2020    Colorectal cancer screening: Type of screening: Colonoscopy. Completed 11/14/2020. Repeat every 5 years  Mammogram status: patient to schedule  Bone Density status: Completed 03/02/2007.  Lung Cancer Screening: (Low Dose CT Chest recommended if Age 62-80 years, 30 pack-year currently smoking OR have quit w/in 15years.) does not qualify.   Lung Cancer Screening Referral: no  Additional Screening:  Hepatitis C Screening: does qualify; Completed 01/29/2016  Vision Screening: Recommended annual ophthalmology exams for early detection of glaucoma and other disorders of the eye. Is the patient up to date with their annual eye exam?  Yes  Who is the provider or what is the name of the office in which the patient attends annual eye exams?  [facility] If pt is not established with a provider, would they like to be referred to a provider to establish care? No .   Dental Screening: Recommended annual dental exams for proper oral hygiene  Community Resource Referral / Chronic Care Management: CRR required this visit?  No   CCM required this visit?  No      Plan:     I have personally reviewed and noted the following in the patients chart:   Medical and social history Use of alcohol, tobacco or illicit drugs  Current medications and supplements including opioid prescriptions.  Functional ability and status Nutritional status Physical activity Advanced directives List of other physicians Hospitalizations, surgeries, and ER visits in previous 12 months Vitals Screenings to include cognitive, depression, and falls Referrals and appointments  In addition, I have reviewed and discussed with patient certain preventive protocols, quality metrics, and best practice recommendations. A written personalized care plan for preventive services as well as general preventive health recommendations were provided to patient.     Barb Merino, LPN   07/18/1759   Nurse Notes: none

## 2021-08-09 ENCOUNTER — Inpatient Hospital Stay: Admission: RE | Admit: 2021-08-09 | Payer: Medicare PPO | Source: Ambulatory Visit

## 2021-08-09 ENCOUNTER — Other Ambulatory Visit: Payer: Self-pay | Admitting: Internal Medicine

## 2021-08-10 ENCOUNTER — Other Ambulatory Visit: Payer: Self-pay | Admitting: Internal Medicine

## 2021-10-31 DIAGNOSIS — H40013 Open angle with borderline findings, low risk, bilateral: Secondary | ICD-10-CM | POA: Diagnosis not present

## 2021-10-31 DIAGNOSIS — H04123 Dry eye syndrome of bilateral lacrimal glands: Secondary | ICD-10-CM | POA: Diagnosis not present

## 2021-10-31 DIAGNOSIS — Z961 Presence of intraocular lens: Secondary | ICD-10-CM | POA: Diagnosis not present

## 2021-11-07 ENCOUNTER — Other Ambulatory Visit: Payer: Self-pay | Admitting: Internal Medicine

## 2021-11-07 DIAGNOSIS — Z1231 Encounter for screening mammogram for malignant neoplasm of breast: Secondary | ICD-10-CM

## 2021-11-13 ENCOUNTER — Ambulatory Visit
Admission: RE | Admit: 2021-11-13 | Discharge: 2021-11-13 | Disposition: A | Discharge status: Home / Self Care | Payer: Medicare PPO | Source: Ambulatory Visit | Attending: Internal Medicine | Admitting: Internal Medicine

## 2021-11-13 DIAGNOSIS — Z1231 Encounter for screening mammogram for malignant neoplasm of breast: Secondary | ICD-10-CM | POA: Diagnosis not present

## 2021-11-15 ENCOUNTER — Other Ambulatory Visit: Payer: Self-pay | Admitting: Internal Medicine

## 2021-11-15 DIAGNOSIS — R928 Other abnormal and inconclusive findings on diagnostic imaging of breast: Secondary | ICD-10-CM

## 2021-11-25 ENCOUNTER — Encounter: Payer: Self-pay | Admitting: Internal Medicine

## 2021-11-25 ENCOUNTER — Ambulatory Visit (INDEPENDENT_AMBULATORY_CARE_PROVIDER_SITE_OTHER): Payer: Medicare PPO | Admitting: Internal Medicine

## 2021-11-25 VITALS — BP 138/68 | HR 60 | Temp 98.4°F | Ht 64.0 in | Wt 233.8 lb

## 2021-11-25 DIAGNOSIS — Z0000 Encounter for general adult medical examination without abnormal findings: Secondary | ICD-10-CM | POA: Diagnosis not present

## 2021-11-25 DIAGNOSIS — E7800 Pure hypercholesterolemia, unspecified: Secondary | ICD-10-CM

## 2021-11-25 DIAGNOSIS — N182 Chronic kidney disease, stage 2 (mild): Secondary | ICD-10-CM

## 2021-11-25 DIAGNOSIS — I129 Hypertensive chronic kidney disease with stage 1 through stage 4 chronic kidney disease, or unspecified chronic kidney disease: Secondary | ICD-10-CM

## 2021-11-25 DIAGNOSIS — Z6841 Body Mass Index (BMI) 40.0 and over, adult: Secondary | ICD-10-CM

## 2021-11-25 DIAGNOSIS — R7309 Other abnormal glucose: Secondary | ICD-10-CM | POA: Diagnosis not present

## 2021-11-25 DIAGNOSIS — Z79899 Other long term (current) drug therapy: Secondary | ICD-10-CM | POA: Diagnosis not present

## 2021-11-25 DIAGNOSIS — R202 Paresthesia of skin: Secondary | ICD-10-CM

## 2021-11-25 LAB — POCT URINALYSIS DIPSTICK
Bilirubin, UA: NEGATIVE
Glucose, UA: POSITIVE — AB
Ketones, UA: NEGATIVE
Nitrite, UA: NEGATIVE
Protein, UA: NEGATIVE
Spec Grav, UA: 1.02 (ref 1.010–1.025)
Urobilinogen, UA: 0.2 E.U./dL
pH, UA: 5 (ref 5.0–8.0)

## 2021-11-25 MED ORDER — LISINOPRIL-HYDROCHLOROTHIAZIDE 20-12.5 MG PO TABS: 1.0000 | ORAL_TABLET | Freq: Every day | ORAL | 2 refills | Status: DC

## 2021-11-25 MED ORDER — ATORVASTATIN CALCIUM 80 MG PO TABS: ORAL_TABLET | ORAL | 2 refills | Status: DC

## 2021-11-25 NOTE — Patient Instructions (Addendum)
The 10-year ASCVD risk score (Arnett DK, et al., 2019) is: 23.1%   Values used to calculate the score:     Age: 76 years     Sex: Female     Is Non-Hispanic African American: Yes     Diabetic: No     Tobacco smoker: No     Systolic Blood Pressure: 138 mmHg     Is BP treated: No     HDL Cholesterol: 74 mg/dL     Total Cholesterol: 253 mg/dL   Health Maintenance, Female Adopting a healthy lifestyle and getting preventive care are important in promoting health and wellness. Ask your health care provider about: The right schedule for you to have regular tests and exams. Things you can do on your own to prevent diseases and keep yourself healthy. What should I know about diet, weight, and exercise? Eat a healthy diet  Eat a diet that includes plenty of vegetables, fruits, low-fat dairy products, and lean protein. Do not eat a lot of foods that are high in solid fats, added sugars, or sodium. Maintain a healthy weight Body mass index (BMI) is used to identify weight problems. It estimates body fat based on height and weight. Your health care provider can help determine your BMI and help you achieve or maintain a healthy weight. Get regular exercise Get regular exercise. This is one of the most important things you can do for your health. Most adults should: Exercise for at least 150 minutes each week. The exercise should increase your heart rate and make you sweat (moderate-intensity exercise). Do strengthening exercises at least twice a week. This is in addition to the moderate-intensity exercise. Spend less time sitting. Even light physical activity can be beneficial. Watch cholesterol and blood lipids Have your blood tested for lipids and cholesterol at 74 years of age, then have this test every 5 years. Have your cholesterol levels checked more often if: Your lipid or cholesterol levels are high. You are older than 74 years of age. You are at high risk for heart disease. What should  I know about cancer screening? Depending on your health history and family history, you may need to have cancer screening at various ages. This may include screening for: Breast cancer. Cervical cancer. Colorectal cancer. Skin cancer. Lung cancer. What should I know about heart disease, diabetes, and high blood pressure? Blood pressure and heart disease High blood pressure causes heart disease and increases the risk of stroke. This is more likely to develop in people who have high blood pressure readings or are overweight. Have your blood pressure checked: Every 3-5 years if you are 66-6 years of age. Every year if you are 72 years old or older. Diabetes Have regular diabetes screenings. This checks your fasting blood sugar level. Have the screening done: Once every three years after age 60 if you are at a normal weight and have a low risk for diabetes. More often and at a younger age if you are overweight or have a high risk for diabetes. What should I know about preventing infection? Hepatitis B If you have a higher risk for hepatitis B, you should be screened for this virus. Talk with your health care provider to find out if you are at risk for hepatitis B infection. Hepatitis C Testing is recommended for: Everyone born from [birth year] through 1963. Anyone with known risk factors for hepatitis C. Sexually transmitted infections (STIs) Get screened for STIs, including gonorrhea and chlamydia, if: You are sexually active  and are younger than 74 years of age. You are older than 74 years of age and your health care provider tells you that you are at risk for this type of infection. Your sexual activity has changed since you were last screened, and you are at increased risk for chlamydia or gonorrhea. Ask your health care provider if you are at risk. Ask your health care provider about whether you are at high risk for HIV. Your health care provider may recommend a prescription medicine to help  prevent HIV infection. If you choose to take medicine to prevent HIV, you should first get tested for HIV. You should then be tested every 3 months for as long as you are taking the medicine. Pregnancy If you are about to stop having your period (premenopausal) and you may become pregnant, seek counseling before you get pregnant. Take 400 to 800 micrograms (mcg) of folic acid every day if you become pregnant. Ask for birth control (contraception) if you want to prevent pregnancy. Osteoporosis and menopause Osteoporosis is a disease in which the bones lose minerals and strength with aging. This can result in bone fractures. If you are 48 years old or older, or if you are at risk for osteoporosis and fractures, ask your health care provider if you should: Be screened for bone loss. Take a calcium or vitamin D supplement to lower your risk of fractures. Be given hormone replacement therapy (HRT) to treat symptoms of menopause. Follow these instructions at home: Alcohol use Do not drink alcohol if: Your health care provider tells you not to drink. You are pregnant, may be pregnant, or are planning to become pregnant. If you drink alcohol: Limit how much you have to: 0-1 drink a day. Know how much alcohol is in your drink. In the [place] one drink equals one 12 oz bottle of beer (355 mL), one 5 oz glass of wine (148 mL), or one 1 oz glass of hard liquor (44 mL). Lifestyle Do not use any products that contain nicotine or tobacco. These products include cigarettes, chewing tobacco, and vaping devices, such as e-cigarettes. If you need help quitting, ask your health care provider. Do not use street drugs. Do not share needles. Ask your health care provider for help if you need support or information about quitting drugs. General instructions Schedule regular health, dental, and eye exams. Stay current with your vaccines. Tell your health care provider if: You often feel depressed. You have ever  been abused or do not feel safe at home. Summary Adopting a healthy lifestyle and getting preventive care are important in promoting health and wellness. Follow your health care provider's instructions about healthy diet, exercising, and getting tested or screened for diseases. Follow your health care provider's instructions on monitoring your cholesterol and blood pressure. This information is not intended to replace advice given to you by your health care provider. Make sure you discuss any questions you have with your health care provider. Document Revised: 09/10/2020 Document Reviewed: 09/10/2020 Elsevier Patient Education  [place].

## 2021-11-25 NOTE — Progress Notes (Signed)
Rich Brave [place]leton,acting as a Education administrator for Maximino Greenland, MD.,have documented all relevant documentation on the behalf of Maximino Greenland, MD,as directed by  Maximino Greenland, MD while in the presence of Maximino Greenland, MD.   Subjective:     Patient ID: Gabrielle [name] , female    DOB: 1946-02-26 , 76 y.o.   MRN: [id number]   Chief Complaint  Patient presents with   Annual Exam    HPI  The patient is here today for a physical examination.  The patient is no longer followed by GYN.  She has no specific concerns or complaints at this time.  She reports compliance with meds. She denies headaches, chest pain and shortness of breath.   Hypertension This is a chronic problem. The current episode started more than 1 year ago. The problem has been gradually improving since onset. The problem is controlled. Pertinent negatives include no blurred vision.     Past Medical History:  Diagnosis Date   Hemorrhoids    Hypertension    Obesity      Family History  Problem Relation Age of Onset   COPD Mother        colon   Heart disease Father    Heart disease Brother      Current Outpatient Medications:    aspirin 81 MG tablet, Take 81 mg by mouth daily., Disp: , Rfl:    Cholecalciferol (DIALYVITE VITAMIN D 5000 PO), Take by mouth daily., Disp: , Rfl:    Multiple Vitamins-Minerals (MULTIVITAMIN PO), Take 1 tablet by mouth daily. , Disp: , Rfl:    atorvastatin (LIPITOR) 80 MG tablet, TAKE 1 TABLET BY MOUTH ONCE DAILY MONDAYS THROUGH SATURDAYS, Disp: 78 tablet, Rfl: 2   lisinopril-hydrochlorothiazide (ZESTORETIC) 20-12.5 MG tablet, Take 1 tablet by mouth daily., Disp: 90 tablet, Rfl: 2   No Known Allergies    The patient states she uses post menopausal status for birth control. Last LMP was No LMP recorded. Patient is postmenopausal.. Negative for Dysmenorrhea. Negative for: breast discharge, breast lump(s), breast pain and breast self exam. Associated symptoms include abnormal vaginal  bleeding. Pertinent negatives include abnormal bleeding (hematology), anxiety, decreased libido, depression, difficulty falling sleep, dyspareunia, history of infertility, nocturia, sexual dysfunction, sleep disturbances, urinary incontinence, urinary urgency, vaginal discharge and vaginal itching. Diet regular.The patient states her exercise level is    . The patient's tobacco use is:  Social History   Tobacco Use  Smoking Status Never  Smokeless Tobacco Never  . She has been exposed to passive smoke. The patient's alcohol use is:  Social History   Substance and Sexual Activity  Alcohol Use No    Review of Systems  Constitutional: Negative.   HENT: Negative.    Eyes: Negative.  Negative for blurred vision.  Respiratory: Negative.    Cardiovascular: Negative.   Gastrointestinal: Negative.   Endocrine: Negative.   Genitourinary: Negative.   Musculoskeletal: Negative.   Skin: Negative.   Allergic/Immunologic: Negative.   Neurological:  Positive for numbness.       She c/o numbness/tingling in right foot. Denies fall/trauma. Not sure what triggered onset of sx. Denies LE weakness.   Hematological: Negative.   Psychiatric/Behavioral: Negative.       Today's Vitals   11/25/21 1053  BP: 138/68  Pulse: 60  Temp: 98.4 F (36.9 C)  Weight: 233 lb 12.8 oz (106.1 kg)  Height: _0  (1.626 m)  PainSc: 0-No pain   Body mass index is 40.13 kg/m.  Wt Readings from Last 3 Encounters:  11/25/21 233 lb 12.8 oz (106.1 kg)  05/23/21 225 lb (102.1 kg)  11/20/20 231 lb 9.6 oz (105.1 kg)     Objective:  Physical Exam Vitals and nursing note reviewed.  Constitutional:      Appearance: Normal appearance.  HENT:     Head: Normocephalic and atraumatic.     Right Ear: Tympanic membrane, ear canal and external ear normal.     Left Ear: Tympanic membrane, ear canal and external ear normal.     Nose: Nose normal.     Mouth/Throat:     Mouth: Mucous membranes are moist.     Pharynx:  Oropharynx is clear.  Eyes:     Extraocular Movements: Extraocular movements intact.     Conjunctiva/sclera: Conjunctivae normal.     Pupils: Pupils are equal, round, and reactive to light.  Cardiovascular:     Rate and Rhythm: Normal rate and regular rhythm.     Pulses: Normal pulses.     Heart sounds: Normal heart sounds.  Pulmonary:     Effort: Pulmonary effort is normal.     Breath sounds: Normal breath sounds.  Chest:  Breasts:    Tanner Score is 5.     Right: Normal.     Left: Normal.  Abdominal:     General: Abdomen is flat. Bowel sounds are normal.     Palpations: Abdomen is soft.  Genitourinary:    Comments: deferred Musculoskeletal:        General: Normal range of motion.     Cervical back: Normal range of motion and neck supple.  Skin:    General: Skin is warm and dry.  Neurological:     General: No focal deficit present.     Mental Status: She is alert and oriented to person, place, and time.  Psychiatric:        Mood and Affect: Mood normal.        Behavior: Behavior normal.       Assessment And Plan:     1. Encounter for general adult medical examination w/o abnormal findings Comments: A full exam was performed. Importance of monthly self breast exams was discussed with the patient. PATIENT IS ADVISED TO GET 30-45 MINUTES REGULAR EXERCISE NO LESS THAN FOUR TO FIVE DAYS PER WEEK - BOTH WEIGHTBEARING EXERCISES AND AEROBIC ARE RECOMMENDED.  PATIENT IS ADVISED TO FOLLOW A HEALTHY DIET WITH AT LEAST SIX FRUITS/VEGGIES PER DAY, DECREASE INTAKE OF RED MEAT, AND TO INCREASE FISH INTAKE TO TWO DAYS PER WEEK.  MEATS/FISH SHOULD NOT BE FRIED, BAKED OR BROILED IS PREFERABLE.  IT IS ALSO IMPORTANT TO CUT BACK ON YOUR SUGAR INTAKE. PLEASE AVOID ANYTHING WITH ADDED SUGAR, CORN SYRUP OR OTHER SWEETENERS. IF YOU MUST USE A SWEETENER, YOU CAN TRY STEVIA. IT IS ALSO IMPORTANT TO AVOID ARTIFICIALLY SWEETENERS AND DIET BEVERAGES. LASTLY, I SUGGEST WEARING SPF 50 SUNSCREEN ON EXPOSED  PARTS AND ESPECIALLY WHEN IN THE DIRECT SUNLIGHT FOR AN EXTENDED PERIOD OF TIME.  PLEASE AVOID FAST FOOD RESTAURANTS AND INCREASE YOUR WATER INTAKE.   2. Hypertensive nephropathy Comments: Chronic, fair control.  Goal BP<130/80.  EKG, SB w/o acute changes.  She will c/w lisinopril/hctz daily for now.  She will f/u in 6 months.  - POCT Urinalysis Dipstick (81002) - Microalbumin / Creatinine Urine Ratio - EKG 12-Lead - CBC - CMP14+EGFR - Lipid panel - lisinopril-hydrochlorothiazide (ZESTORETIC) 20-12.5 MG tablet; Take 1 tablet by mouth daily.  Dispense: 90 tablet; Refill: 2  3. Chronic renal disease, stage II Comments: Chronic, encouraged to avoid NSAIDs, stay hydrated and keep BP well controlled to decrease risk of CKD progression.   4. Hypercholesterolemia Comments: Chronic, she is on atorvastatin 65m daily. She is encouraged to follow heart healthy lifestyle.  - CMP14+EGFR - Lipid panel - atorvastatin (LIPITOR) 80 MG tablet; TAKE 1 TABLET BY MOUTH ONCE DAILY MONDAYS THROUGH SATURDAYS  Dispense: 78 tablet; Refill: 2  5. Other abnormal glucose Comments: Her a1c has been elevated in the past. I will recheck this today. She is encouraged to limit her intake of sugary beverages and foods.  - Hemoglobin A1c  6. Paresthesia of right foot Comments: I will check vitamin B12 level.  If persistent, will consider nerve conduction study.   7. Class 3 severe obesity due to excess calories with serious comorbidity and body mass index (BMI) of 40.0 to 44.9 in adult ([facility] Comments: BMI 40. She has gained 8 lbs since Jan 2023. She is encouraged to incorporate more exercise into her daily routine, aiming for 150 minutes/week.   8. Drug therapy - Vitamin B12  Patient was given opportunity to ask questions. Patient verbalized understanding of the plan and was able to repeat key elements of the plan. All questions were answered to their satisfaction.   I, RMaximino Greenland MD, have reviewed all  documentation for this visit. The documentation on 12/02/21 for the exam, diagnosis, procedures, and orders are all accurate and complete.   THE PATIENT IS ENCOURAGED TO PRACTICE SOCIAL DISTANCING DUE TO THE COVID-19 PANDEMIC.

## 2021-11-26 ENCOUNTER — Ambulatory Visit
Admission: RE | Admit: 2021-11-26 | Discharge: 2021-11-26 | Disposition: A | Discharge status: Home / Self Care | Payer: Medicare PPO | Source: Ambulatory Visit | Attending: Internal Medicine | Admitting: Internal Medicine

## 2021-11-26 DIAGNOSIS — R928 Other abnormal and inconclusive findings on diagnostic imaging of breast: Secondary | ICD-10-CM | POA: Diagnosis not present

## 2021-11-26 DIAGNOSIS — N6489 Other specified disorders of breast: Secondary | ICD-10-CM | POA: Diagnosis not present

## 2021-11-26 LAB — LIPID PANEL
Chol/HDL Ratio: 2.9 ratio (ref 0.0–4.4)
Cholesterol, Total: 252 mg/dL — ABNORMAL HIGH (ref 100–199)
HDL: 88 mg/dL (ref >39)
LDL Chol Calc (NIH): 149 mg/dL — ABNORMAL HIGH (ref 0–99)
Triglycerides: 91 mg/dL (ref 0–149)
VLDL Cholesterol Cal: 15 mg/dL (ref 5–40)

## 2021-11-26 LAB — CBC
Hematocrit: 39 % (ref 34.0–46.6)
Hemoglobin: 12.4 g/dL (ref 11.1–15.9)
MCH: 28.6 pg (ref 26.6–33.0)
MCHC: 31.8 g/dL (ref 31.5–35.7)
MCV: 90 fL (ref 79–97)
Platelets: 323 10*3/uL (ref 150–450)
RBC: 4.33 x10E6/uL (ref 3.77–5.28)
RDW: 13.8 % (ref 11.7–15.4)
WBC: 8.4 10*3/uL (ref 3.4–10.8)

## 2021-11-26 LAB — CMP14+EGFR
ALT: 15 IU/L (ref 0–32)
AST: 16 IU/L (ref 0–40)
Albumin/Globulin Ratio: 1.5 (ref 1.2–2.2)
Albumin: 4.4 g/dL (ref 3.8–4.8)
Alkaline Phosphatase: 81 IU/L (ref 44–121)
BUN/Creatinine Ratio: 16 (ref 12–28)
BUN: 17 mg/dL (ref 8–27)
Bilirubin Total: 0.6 mg/dL (ref 0.0–1.2)
CO2: 26 mmol/L (ref 20–29)
Calcium: 10.3 mg/dL (ref 8.7–10.3)
Chloride: 99 mmol/L (ref 96–106)
Creatinine, Ser: 1.09 mg/dL — ABNORMAL HIGH (ref 0.57–1.00)
Globulin, Total: 2.9 g/dL (ref 1.5–4.5)
Glucose: 99 mg/dL (ref 70–99)
Potassium: 4.7 mmol/L (ref 3.5–5.2)
Sodium: 142 mmol/L (ref 134–144)
Total Protein: 7.3 g/dL (ref 6.0–8.5)
eGFR: 53 mL/min/{1.73_m2} — ABNORMAL LOW (ref >59)

## 2021-11-26 LAB — VITAMIN B12: Vitamin B-12: 445 pg/mL (ref 232–1245)

## 2021-11-26 LAB — MICROALBUMIN / CREATININE URINE RATIO
Creatinine, Urine: 97.4 mg/dL
Microalb/Creat Ratio: <3 mg/g creat (ref 0–29)
Microalbumin, Urine: <3 ug/mL

## 2021-11-26 LAB — HEMOGLOBIN A1C
Est. average glucose Bld gHb Est-mCnc: 134 mg/dL
Hgb A1c MFr Bld: 6.3 % — ABNORMAL HIGH (ref 4.8–5.6)

## 2021-12-20 IMAGING — RF DG UGI W SINGLE CM
5 series · 14 of 24 positions shown · non-contrast
Comparison: 03/01/2017

CLINICAL DATA: Gastric band malfunction.

EXAM:
UPPER GI SERIES WITH KUB
TECHNIQUE: After obtaining a scout radiograph a routine upper GI series was
performed using thin and high density barium.
FLUOROSCOPY TIME:  Fluoroscopy Time:  3 minutes 12 seconds
Radiation Exposure Index (if provided by the fluoroscopic device):
Not available
Number of Acquired Spot Images: 0

[Series 1: one shot · 0.14mm/px · 1 of 1 slices shown (1 of 3)]
[im 1/1]
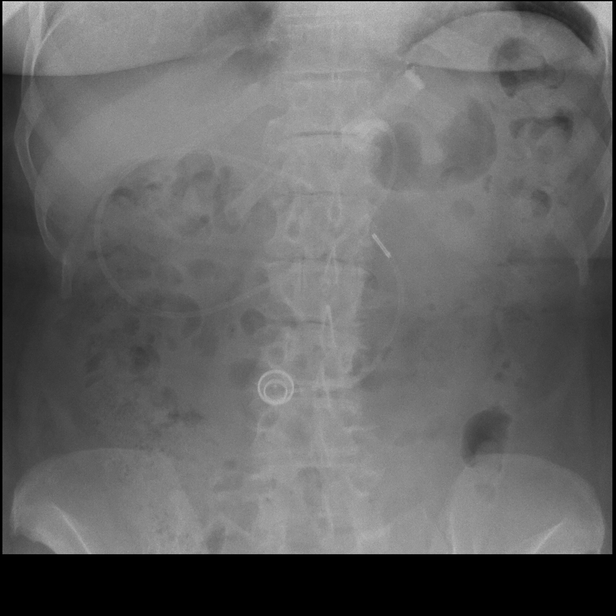

[Series 2: sequence · 1 of 68 frames shown (1 of 2)]
[frame 35/68]
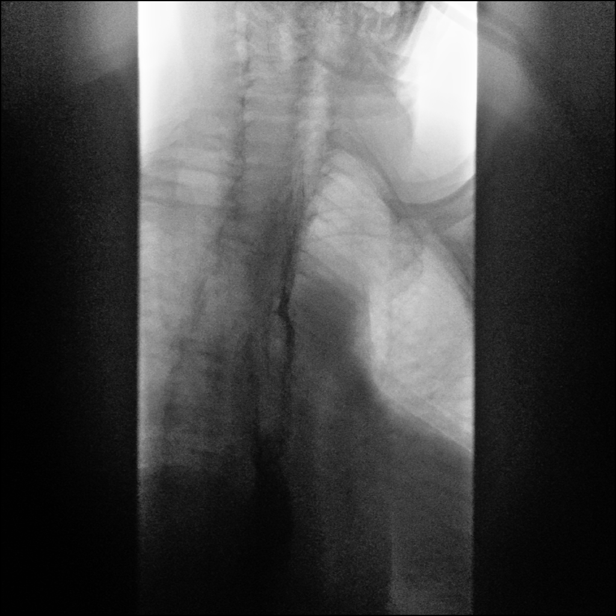

[Series 3: one shot · 9 of 17 slices shown (2 of 3)]
[im 1/17]
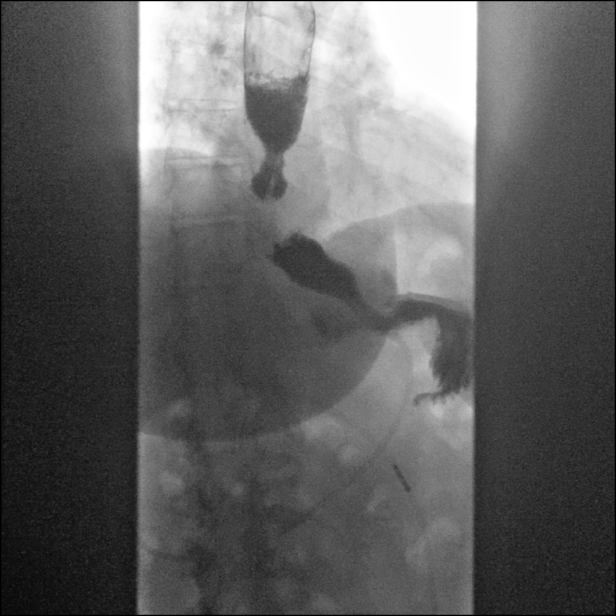
[im 3/17]
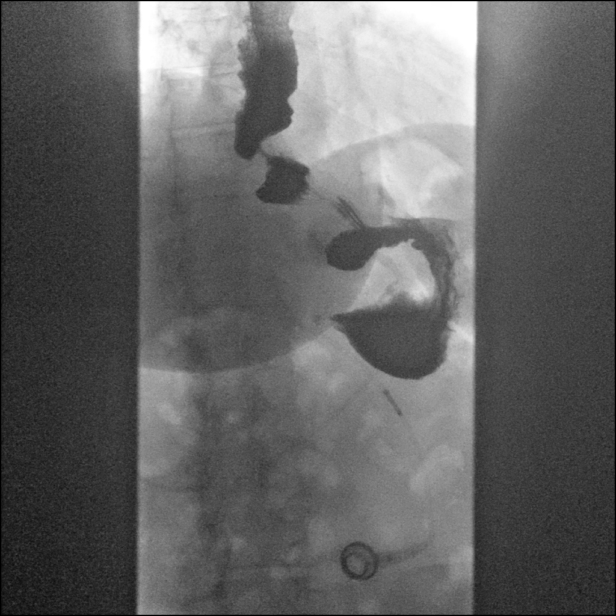
[im 4/17]
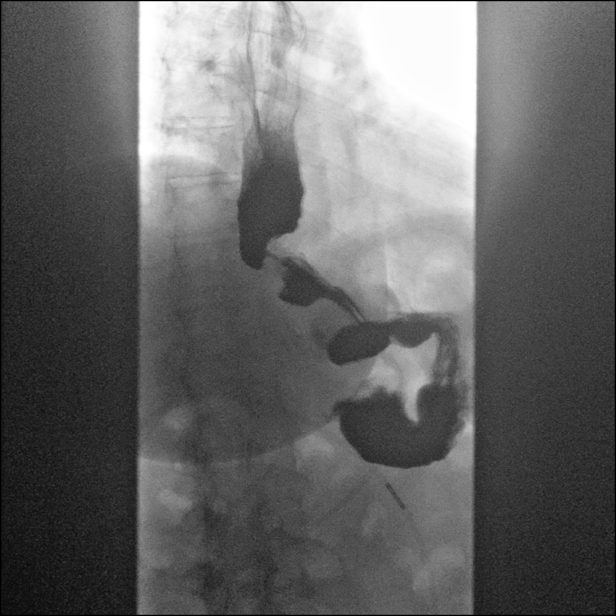
[im 7/17]
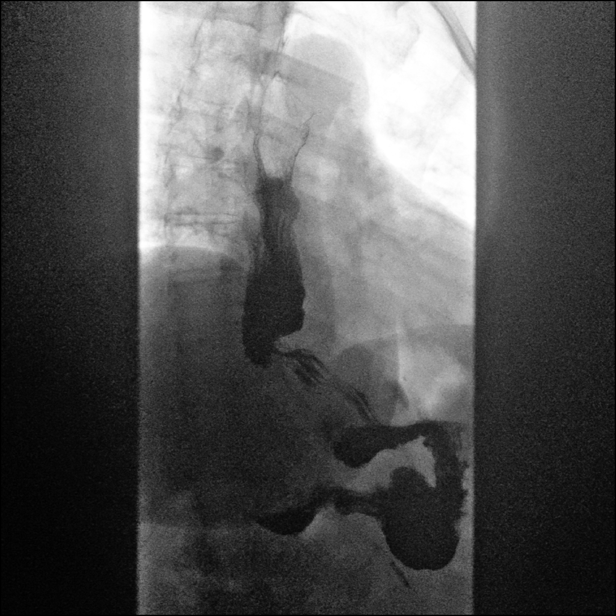
[im 9/17]
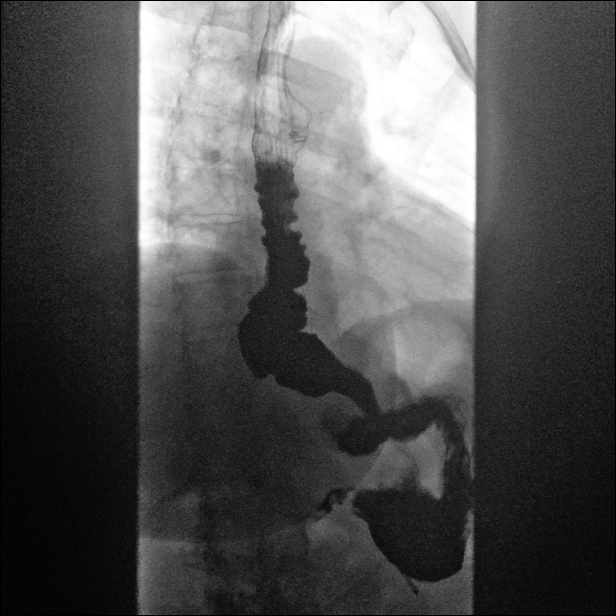
[im 10/17]
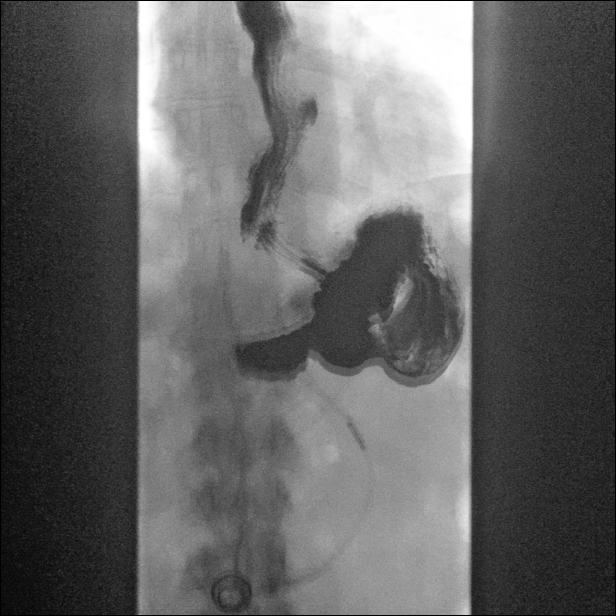
[im 12/17]
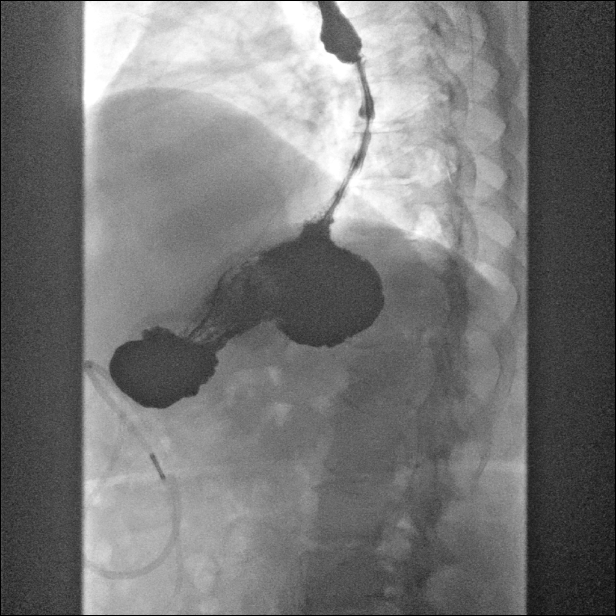
[im 15/17]
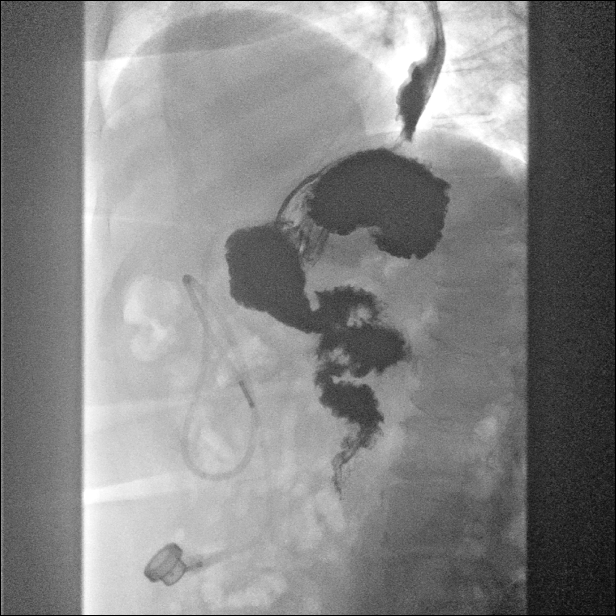
[im 17/17]
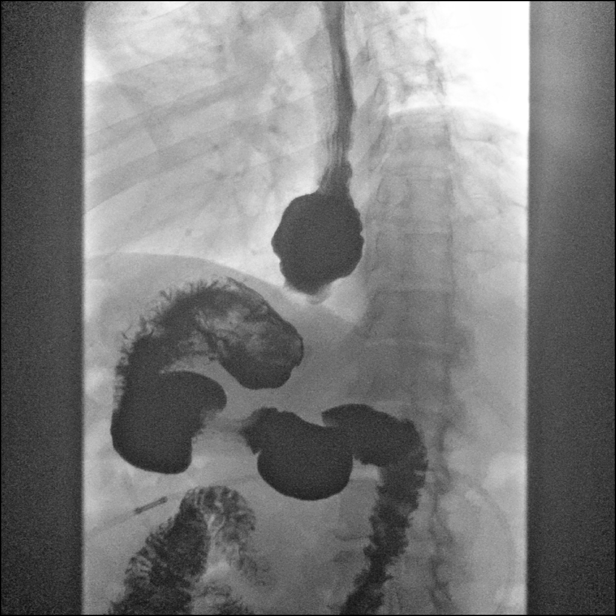

[Series 4: sequence · 2 of 12 frames shown (2 of 2)]
[frame 2/12]
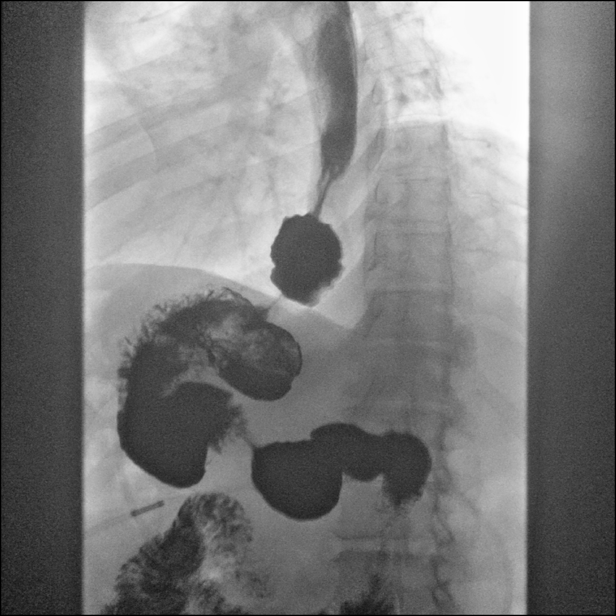
[frame 12/12]
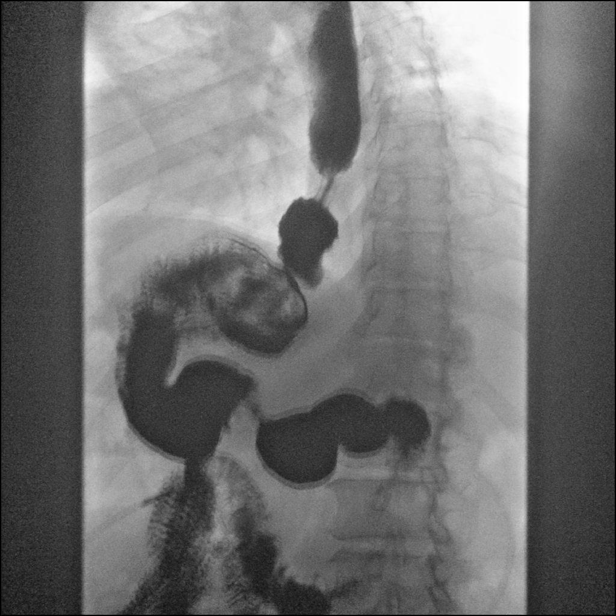

[Series 5: one shot · 1 of 2 slices shown (3 of 3)]
[im 2/2]
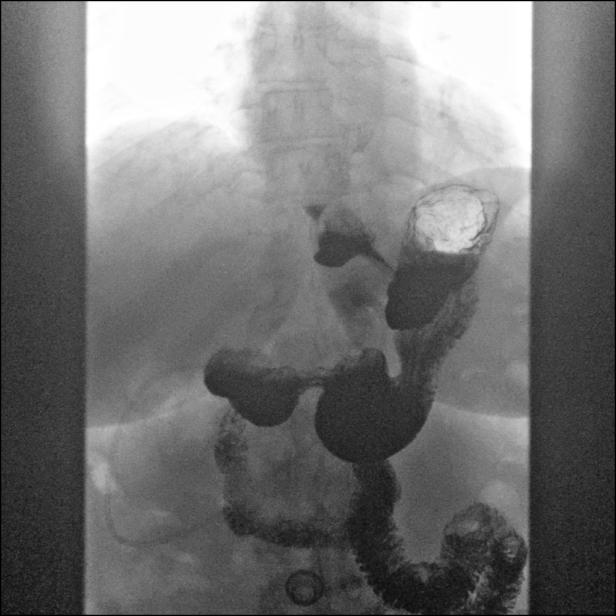

[14 of 24 positions shown; findings below may reference images not displayed]

FINDINGS: On the scout radiograph the gastric band is identified oriented in
the 2 o'clock to 8 o'clock position. The gastric band catheter
appears intact. Contrast freely flowed through the gastric band into
the stomach. No stricture or mass identified within the esophagus.
Barium tablet was ingested which easily passed through the esophagus
and into the stomach, beyond the gastric band. The esophagus appears
increased in caliber. Moderate esophageal dysmotility noted with
multiple non propulsive tertiary waves identified throughout the
exam. A small hiatal hernia was identified. The stomach, duodenal
bulb and C loop have an otherwise normal appearance. No significant
reflux identified.
IMPRESSION: 1. Mildly dilated esophagus with moderate esophageal dysmotility.
2. Small hiatal hernia above the gastric band.
3. No high-grade stricture or mass identified.

## 2021-12-27 ENCOUNTER — Other Ambulatory Visit: Payer: Self-pay | Admitting: Internal Medicine

## 2021-12-27 DIAGNOSIS — E7800 Pure hypercholesterolemia, unspecified: Secondary | ICD-10-CM

## 2022-01-09 ENCOUNTER — Encounter: Payer: Self-pay | Admitting: Internal Medicine

## 2022-01-09 ENCOUNTER — Telehealth (INDEPENDENT_AMBULATORY_CARE_PROVIDER_SITE_OTHER): Payer: Medicare PPO | Admitting: Internal Medicine

## 2022-01-09 DIAGNOSIS — N950 Postmenopausal bleeding: Secondary | ICD-10-CM

## 2022-01-09 NOTE — Patient Instructions (Signed)
Abnormal Uterine Bleeding ? ?Abnormal uterine bleeding is unusual bleeding from the uterus. It includes bleeding after sex, or bleeding or spotting between menstrual periods. It may also include bleeding that is heavier than normal, menstrual periods that last longer than usual, or bleeding that occurs after menopause. ?Abnormal uterine bleeding can affect teenagers, women in their reproductive years, pregnant women, and women who have reached menopause. Common causes of abnormal uterine bleeding include: ?Pregnancy. ?Abnormal growths within the lining of the uterus (polyps). ?Benign tumors or growths in the uterus (fibroids). These are not cancer. ?Infection. ?Cancer. ?Too much or too little of some hormones in the body (hormonal imbalances). ?Any type of abnormal bleeding should be checked by a health care provider. Many cases are minor and simple to treat, but others may be more serious. Treatment will depend on the cause of the bleeding and how severe it is. ?Follow these instructions at home: ?Medicines ?Take over-the-counter and prescription medicines only as told by your health care provider. ?Ask your health care provider about: ?Taking medicines such as aspirin and ibuprofen. These medicines can thin your blood. Do not take these medicines unless your health care provider tells you to take them. ?Taking over-the-counter medicines, vitamins, herbs, and supplements. ?If you were prescribed iron pills, take them as told by your health care provider. Iron pills help to replace iron that your body loses because of this condition. ?Managing constipation ?In cases of severe bleeding, you may be asked to increase your iron intake to treat anemia. Doing this may cause constipation. To prevent or treat constipation, you may need to: ?Drink enough fluid to keep your urine pale yellow. ?Take over-the-counter or prescription medicines. ?Eat foods that are high in fiber, such as beans, whole grains, and fresh fruits and  vegetables. ?Limit foods that are high in fat and processed sugars, such as fried or sweet foods. ?Activity ?Alter your activity to decrease bleeding if you need to change your sanitary pad more than one time every 2 hours: ?Lie in bed with your feet raised (elevated). ?Place a cold pack on your lower abdomen. ?Rest as much as possible until the bleeding stops or slows down. ?General instructions ?Do not use tampons, douche, or have sex until your health care provider says these things are okay. ?Change your sanitary pads often. ?Get regular exams. These include pelvic exams and cervical cancer screenings. ?It is up to you to get the results of any tests that are done. Ask your health care provider, or the department that is doing the tests, when your results will be ready. ?Monitor your condition for any changes. For 2 months, write down: ?When your menstrual period starts. ?When your menstrual period ends. ?When any abnormal vaginal bleeding occurs. ?What problems you notice. ?Keep all follow-up visits. This is important. ?Contact a health care provider if: ?You have bleeding that lasts for more than one week. ?You feel dizzy at times. ?You feel nauseous or you vomit. ?You feel light-headed or weak. ?You notice any other changes that show that your condition is getting worse. ?Get help right away if: ?You faint. ?You have bleeding that soaks through a sanitary pad every hour. ?You have pain in the abdomen. ?You have a fever or chills. ?You become sweaty or weak. ?You pass large blood clots from your vagina. ?These symptoms may represent a serious problem that is an emergency. Do not wait to see if the symptoms will go away. Get medical help right away. Call your local emergency services (  911 in the [place] Do not drive yourself to the hospital. ?Summary ?Abnormal uterine bleeding is unusual bleeding from the uterus. ?Any type of abnormal bleeding should be checked by a health care provider. Many cases are minor and  simple to treat, but others may be more serious. ?Treatment will depend on the cause of the bleeding and how severe it is. ?Get help right away if you faint, you have bleeding that soaks through a sanitary pad every hour, or you pass large blood clots from your vagina. ?This information is not intended to replace advice given to you by your health care provider. Make sure you discuss any questions you have with your health care provider. ?Document Revised: 08/21/2020 Document Reviewed: 08/21/2020 ?Elsevier Patient Education ? 2023 [employer] ? ?

## 2022-01-09 NOTE — Progress Notes (Signed)
Virtual Visit via Video   This visit type was conducted due to national recommendations for restrictions regarding the COVID-19 Pandemic (e.g. social distancing) in an effort to limit this patient's exposure and mitigate transmission in our community.  Due to her co-morbid illnesses, this patient is at least at moderate risk for complications without adequate follow up.  This format is felt to be most appropriate for this patient at this time.  All issues noted in this document were discussed and addressed.  A limited physical exam was performed with this format.    This visit type was conducted due to national recommendations for restrictions regarding the COVID-19 Pandemic (e.g. social distancing) in an effort to limit this patient's exposure and mitigate transmission in our community.  Patients identity confirmed using two different identifiers.  This format is felt to be most appropriate for this patient at this time.  All issues noted in this document were discussed and addressed.  No physical exam was performed (except for noted visual exam findings with Video Visits).    Date:  01/09/2022   ID:  Gabrielle, [name] 1946/02/14, MRN [id number]  Patient Location:  Home  Provider location:   Office    Chief Complaint:  "I have vaginal bleeding and want to go to GYN"  History of Present Illness:    Gabrielle [name] is a 76 y.o. female who presents via video conferencing for a telehealth visit today.    The patient does not have symptoms concerning for COVID-19 infection (fever, chills, cough, or new shortness of breath).   She presents today for virtual visit. She prefers this method of contact due to COVID-19 pandemic. Patient presents today for vaginal bleeding. She states her first episode occurred about a month ago. It typically occurs after mowing the lawn or exercising. Denies having any associated pelvic pain, but she has noticed some bloating.   She states her menses stopped at  age 86.   Vaginal Bleeding The patient's primary symptoms include vaginal bleeding. The patient's pertinent negatives include no genital itching, genital lesions, genital odor or pelvic pain. The current episode started 1 to 4 weeks ago. The problem occurs intermittently. The problem has been unchanged. Pertinent negatives include no abdominal pain. The vaginal discharge was bloody.     Past Medical History:  Diagnosis Date   Hemorrhoids    Hypertension    Obesity    Past Surgical History:  Procedure Laterality Date   BREAST EXCISIONAL BIOPSY Left 1991   BREAST SURGERY     CATARACT EXTRACTION Bilateral    01/18/2021 and 02/22/2021   LAPAROSCOPIC GASTRIC BANDING  2009   TUBAL LIGATION       Current Meds  Medication Sig   aspirin 81 MG tablet Take 81 mg by mouth daily.   atorvastatin (LIPITOR) 80 MG tablet TAKE 1 TABLET BY MOUTH ONCE DAILY ON MONDAYS THROUGH SATURDAYS   Cholecalciferol (DIALYVITE VITAMIN D 5000 PO) Take by mouth daily.   lisinopril-hydrochlorothiazide (ZESTORETIC) 20-12.5 MG tablet Take 1 tablet by mouth daily.   Multiple Vitamins-Minerals (MULTIVITAMIN PO) Take 1 tablet by mouth daily.      Allergies:   Patient has no known allergies.   Social History   Tobacco Use   Smoking status: Never   Smokeless tobacco: Never  Vaping Use   Vaping Use: Never used  Substance Use Topics   Alcohol use: No   Drug use: No     Family Hx: The patient's family history includes COPD in  her mother; Heart disease in her brother and father.  ROS:   Please see the history of present illness.    Review of Systems  Constitutional: Negative.   Respiratory: Negative.    Cardiovascular: Negative.   Gastrointestinal: Negative.  Negative for abdominal pain.  Genitourinary:  Positive for vaginal bleeding. Negative for pelvic pain.       Vaginal bleeding  Neurological: Negative.   Psychiatric/Behavioral: Negative.      All other systems reviewed and are  negative.   Labs/Other Tests and Data Reviewed:    Recent Labs: 11/25/2021: ALT 15; BUN 17; Creatinine, Ser 1.09; Hemoglobin 12.4; Platelets 323; Potassium 4.7; Sodium 142   Recent Lipid Panel Lab Results  Component Value Date/Time   CHOL 252 (H) 11/25/2021 12:32 PM   TRIG 91 11/25/2021 12:32 PM   HDL 88 11/25/2021 12:32 PM   CHOLHDL 2.9 11/25/2021 12:32 PM   LDLCALC 149 (H) 11/25/2021 12:32 PM    Wt Readings from Last 3 Encounters:  11/25/21 233 lb 12.8 oz (106.1 kg)  05/23/21 225 lb (102.1 kg)  11/20/20 231 lb 9.6 oz (105.1 kg)     Exam:    Vital Signs:  There were no vitals taken for this visit.    Physical Exam Vitals and nursing note reviewed.  Constitutional:      Appearance: Normal appearance.  HENT:     Head: Normocephalic and atraumatic.  Eyes:     Extraocular Movements: Extraocular movements intact.  Pulmonary:     Effort: Pulmonary effort is normal.  Musculoskeletal:     Cervical back: Normal range of motion.  Neurological:     Mental Status: She is alert and oriented to person, place, and time.  Psychiatric:        Mood and Affect: Affect normal.     ASSESSMENT & PLAN:    1. Postmenopausal bleeding Comments: She agrees to pelvic/transvaginal ultrasound. I will also refer her for GYN evaluation that will likely include EMBx.  - Ambulatory referral to Gynecology - TSH; Future - CBC no Diff; Future - US Pelvic Complete With Transvaginal; Future    COVID-19 Education: The signs and symptoms of COVID-19 were discussed with the patient and how to seek care for testing (follow up with PCP or arrange E-visit).  The importance of social distancing was discussed today.  Patient Risk:   After full review of this patients clinical status, I feel that they are at least moderate risk at this time.  Time:   Today, I have spent 8 minutes/ seconds with the patient with telehealth technology discussing above diagnoses.     Medication Adjustments/Labs and  Tests Ordered: Current medicines are reviewed at length with the patient today.  Concerns regarding medicines are outlined above.   Tests Ordered: Orders Placed This Encounter  Procedures   US Pelvic Complete With Transvaginal   TSH   CBC no Diff   Ambulatory referral to Gynecology    Medication Changes: No orders of the defined types were placed in this encounter.   Disposition:  Follow up prn  Signed, Gwynneth Aliment, MD

## 2022-01-13 ENCOUNTER — Other Ambulatory Visit (INDEPENDENT_AMBULATORY_CARE_PROVIDER_SITE_OTHER): Payer: Medicare PPO

## 2022-01-13 DIAGNOSIS — N950 Postmenopausal bleeding: Secondary | ICD-10-CM

## 2022-01-14 ENCOUNTER — Encounter: Payer: Self-pay | Admitting: Internal Medicine

## 2022-01-14 LAB — TSH: TSH: 1.05 u[IU]/mL (ref 0.450–4.500)

## 2022-01-14 LAB — CBC
Hematocrit: 40.2 % (ref 34.0–46.6)
Hemoglobin: 13.7 g/dL (ref 11.1–15.9)
MCH: 31.1 pg (ref 26.6–33.0)
MCHC: 34.1 g/dL (ref 31.5–35.7)
MCV: 91 fL (ref 79–97)
Platelets: 184 10*3/uL (ref 150–450)
RBC: 4.4 x10E6/uL (ref 3.77–5.28)
RDW: 12.6 % (ref 11.7–15.4)
WBC: 2.7 10*3/uL — ABNORMAL LOW (ref 3.4–10.8)

## 2022-01-15 ENCOUNTER — Encounter: Payer: Self-pay | Admitting: Internal Medicine

## 2022-01-15 ENCOUNTER — Ambulatory Visit
Admission: RE | Admit: 2022-01-15 | Discharge: 2022-01-15 | Disposition: A | Discharge status: Home / Self Care | Payer: Medicare PPO | Source: Ambulatory Visit | Attending: Internal Medicine | Admitting: Internal Medicine

## 2022-01-15 DIAGNOSIS — N950 Postmenopausal bleeding: Secondary | ICD-10-CM | POA: Diagnosis not present

## 2022-01-15 DIAGNOSIS — R9389 Abnormal findings on diagnostic imaging of other specified body structures: Secondary | ICD-10-CM | POA: Diagnosis not present

## 2022-01-29 ENCOUNTER — Encounter: Payer: Self-pay | Admitting: Internal Medicine

## 2022-01-29 DIAGNOSIS — E669 Obesity, unspecified: Secondary | ICD-10-CM | POA: Diagnosis not present

## 2022-01-29 DIAGNOSIS — N841 Polyp of cervix uteri: Secondary | ICD-10-CM | POA: Diagnosis not present

## 2022-01-29 DIAGNOSIS — N950 Postmenopausal bleeding: Secondary | ICD-10-CM | POA: Diagnosis not present

## 2022-01-29 DIAGNOSIS — Z124 Encounter for screening for malignant neoplasm of cervix: Secondary | ICD-10-CM | POA: Diagnosis not present

## 2022-01-29 DIAGNOSIS — I10 Essential (primary) hypertension: Secondary | ICD-10-CM | POA: Diagnosis not present

## 2022-02-07 ENCOUNTER — Other Ambulatory Visit: Payer: Self-pay

## 2022-02-07 DIAGNOSIS — N950 Postmenopausal bleeding: Secondary | ICD-10-CM | POA: Diagnosis not present

## 2022-02-07 DIAGNOSIS — N840 Polyp of corpus uteri: Secondary | ICD-10-CM | POA: Diagnosis not present

## 2022-02-07 DIAGNOSIS — E669 Obesity, unspecified: Secondary | ICD-10-CM | POA: Diagnosis not present

## 2022-02-07 DIAGNOSIS — I10 Essential (primary) hypertension: Secondary | ICD-10-CM | POA: Diagnosis not present

## 2022-02-07 HISTORY — PX: HYSTEROSCOPY WITH D & C: SHX1775

## 2022-05-27 NOTE — Progress Notes (Signed)
Rich Brave [place],acting as a Education administrator for Maximino Greenland, MD.,have documented all relevant documentation on the behalf of Maximino Greenland, MD,as directed by  Maximino Greenland, MD while in the presence of Maximino Greenland, MD.    Subjective:     Patient ID: Gabrielle [name] , female    DOB: 27-Aug-1945 , 77 y.o.   MRN: [id number]   Chief Complaint  Patient presents with   Hypertension    HPI  Pt presents today for bp check. Patient reports compliance with her meds.  She denies having any headaches, chest pain and shortness of breath. She has no specific concerns or complaints at this time.   Hypertension This is a chronic problem. The current episode started more than 1 year ago. The problem has been gradually improving since onset. The problem is controlled. Pertinent negatives include no blurred vision, chest pain, palpitations or shortness of breath. Risk factors for coronary artery disease include sedentary lifestyle, post-menopausal state and obesity. Hypertensive end-organ damage includes kidney disease.     Past Medical History:  Diagnosis Date   Hemorrhoids    Hypertension    Obesity      Family History  Problem Relation Age of Onset   COPD Mother        colon   Heart disease Father    Heart disease Brother      Current Outpatient Medications:    aspirin 81 MG tablet, Take 81 mg by mouth daily., Disp: , Rfl:    b complex vitamins capsule, Take 1 capsule by mouth daily., Disp: , Rfl:    Cholecalciferol (DIALYVITE VITAMIN D 5000 PO), Take by mouth daily., Disp: , Rfl:    Multiple Vitamins-Minerals (MULTIVITAMIN PO), Take 1 tablet by mouth daily. , Disp: , Rfl:    atorvastatin (LIPITOR) 80 MG tablet, TAKE 1 TABLET BY MOUTH ONCE DAILY ON MONDAYS THROUGH SATURDAYS, Disp: 78 tablet, Rfl: 2   lisinopril-hydrochlorothiazide (ZESTORETIC) 20-12.5 MG tablet, Take 1 tablet by mouth daily., Disp: 90 tablet, Rfl: 2   No Known Allergies   Review of Systems  Constitutional:  Negative.   Eyes: Negative.  Negative for blurred vision.  Respiratory: Negative.  Negative for shortness of breath.   Cardiovascular: Negative.  Negative for chest pain and palpitations.  Musculoskeletal: Negative.   Skin: Negative.   Neurological: Negative.   Psychiatric/Behavioral: Negative.       Today's Vitals   05/28/22 1037  BP: 130/70  Pulse: 77  Temp: 98 F (36.7 C)  Weight: 231 lb 9.6 oz (105.1 kg)  Height: 5\' 4"  (1.626 m)  PainSc: 0-No pain   Body mass index is 39.75 kg/m.  Wt Readings from Last 3 Encounters:  05/28/22 231 lb 9.6 oz (105.1 kg)  11/25/21 233 lb 12.8 oz (106.1 kg)  05/23/21 225 lb (102.1 kg)    Objective:  Physical Exam Vitals and nursing note reviewed.  Constitutional:      Appearance: Normal appearance. She is obese.  HENT:     Head: Normocephalic and atraumatic.     Nose:     Comments: Masked    Mouth/Throat:     Comments: Masked  Eyes:     Extraocular Movements: Extraocular movements intact.  Cardiovascular:     Rate and Rhythm: Normal rate and regular rhythm.     Heart sounds: Normal heart sounds.  Pulmonary:     Effort: Pulmonary effort is normal.  Musculoskeletal:     Cervical back: Normal range of motion.  Skin:  General: Skin is warm.  Neurological:     General: No focal deficit present.     Mental Status: She is alert.  Psychiatric:        Mood and Affect: Mood normal.        Behavior: Behavior normal.         Assessment And Plan:     1. Hypertensive nephropathy Comments: Chronic, fair control. She will c/w lisinopril/hct. She is encouraged to follow low sodium diet. She will f/u in six months for CPE. - lisinopril-hydrochlorothiazide (ZESTORETIC) 20-12.5 MG tablet; Take 1 tablet by mouth daily.  Dispense: 90 tablet; Refill: 2 - CMP14+EGFR - Lipid panel  2. Chronic renal disease, stage II Comments: Chronic, encouraged to stay well hydrated, avoid NSAIDs and keep BP well controlled to decrease risk of CKD  progression.  3. Hypercholesterolemia Comments: Chronic, she is on atorvastatin 80mg  daily. She is encouraged to follow heart healthy lifestyle.  - atorvastatin (LIPITOR) 80 MG tablet; TAKE 1 TABLET BY MOUTH ONCE DAILY ON MONDAYS THROUGH SATURDAYS  Dispense: 78 tablet; Refill: 2 - CMP14+EGFR - Lipid panel  4. Other abnormal glucose Comments: Her a1c has been elevated in the past. I will recheck this today. Encouraged to decrease her intake of sugary beverages/foods and increase daily activity. - Hemoglobin A1c  5. Class 2 severe obesity due to excess calories with serious comorbidity and body mass index (BMI) of 39.0 to 39.9 in adult [facility]) Comments: She is s/p lap banding in 2009. Encouraged to aim for at least 150 minutes of exercise/week, while initially striving for BMI<35 to decrease cardiac risk.   Patient was given opportunity to ask questions. Patient verbalized understanding of the plan and was able to repeat key elements of the plan. All questions were answered to their satisfaction.   I, Maximino Greenland, MD, have reviewed all documentation for this visit. The documentation on 05/28/22 for the exam, diagnosis, procedures, and orders are all accurate and complete.   IF YOU HAVE BEEN REFERRED TO A SPECIALIST, IT MAY TAKE 1-2 WEEKS TO SCHEDULE/PROCESS THE REFERRAL. IF YOU HAVE NOT HEARD FROM US/SPECIALIST IN TWO WEEKS, PLEASE GIVE [place] A CALL AT [phone].   THE PATIENT IS ENCOURAGED TO PRACTICE SOCIAL DISTANCING DUE TO THE COVID-19 PANDEMIC.

## 2022-05-27 NOTE — Patient Instructions (Signed)
Hypertension, Adult Hypertension is another name for high blood pressure. High blood pressure forces your heart to work harder to pump blood. This can cause problems over time. There are two numbers in a blood pressure reading. There is a top number (systolic) over a bottom number (diastolic). It is best to have a blood pressure that is below 120/80. What are the causes? The cause of this condition is not known. Some other conditions can lead to high blood pressure. What increases the risk? Some lifestyle factors can make you more likely to develop high blood pressure: Smoking. Not getting enough exercise or physical activity. Being overweight. Having too much fat, sugar, calories, or salt (sodium) in your diet. Drinking too much alcohol. Other risk factors include: Having any of these conditions: Heart disease. Diabetes. High cholesterol. Kidney disease. Obstructive sleep apnea. Having a family history of high blood pressure and high cholesterol. Age. The risk increases with age. Stress. What are the signs or symptoms? High blood pressure may not cause symptoms. Very high blood pressure (hypertensive crisis) may cause: Headache. Fast or uneven heartbeats (palpitations). Shortness of breath. Nosebleed. Vomiting or feeling like you may vomit (nauseous). Changes in how you see. Very bad chest pain. Feeling dizzy. Seizures. How is this treated? This condition is treated by making healthy lifestyle changes, such as: Eating healthy foods. Exercising more. Drinking less alcohol. Your doctor may prescribe medicine if lifestyle changes do not help enough and if: Your top number is above 130. Your bottom number is above 80. Your personal target blood pressure may vary. Follow these instructions at home: Eating and drinking  If told, follow the DASH eating plan. To follow this plan: Fill one half of your plate at each meal with fruits and vegetables. Fill one fourth of your plate  at each meal with whole grains. Whole grains include whole-wheat pasta, brown rice, and whole-grain bread. Eat or drink low-fat dairy products, such as skim milk or low-fat yogurt. Fill one fourth of your plate at each meal with low-fat (lean) proteins. Low-fat proteins include fish, chicken without skin, eggs, beans, and tofu. Avoid fatty meat, cured and processed meat, or chicken with skin. Avoid pre-made or processed food. Limit the amount of salt in your diet to less than 1,500 mg each day. Do not drink alcohol if: Your doctor tells you not to drink. You are pregnant, may be pregnant, or are planning to become pregnant. If you drink alcohol: Limit how much you have to: 0-1 drink a day for women. 0-2 drinks a day for men. Know how much alcohol is in your drink. In the [place] one drink equals one 12 oz bottle of beer (355 mL), one 5 oz glass of wine (148 mL), or one 1 oz glass of hard liquor (44 mL). Lifestyle  Work with your doctor to stay at a healthy weight or to lose weight. Ask your doctor what the best weight is for you. Get at least 30 minutes of exercise that causes your heart to beat faster (aerobic exercise) most days of the week. This may include walking, swimming, or biking. Get at least 30 minutes of exercise that strengthens your muscles (resistance exercise) at least 3 days a week. This may include lifting weights or doing Pilates. Do not smoke or use any products that contain nicotine or tobacco. If you need help quitting, ask your doctor. Check your blood pressure at home as told by your doctor. Keep all follow-up visits. Medicines Take over-the-counter and prescription medicines  only as told by your doctor. Follow directions carefully. ?Do not skip doses of blood pressure medicine. The medicine does not work as well if you skip doses. Skipping doses also puts you at risk for problems. ?Ask your doctor about side effects or reactions to medicines that you should watch  for. ?Contact a doctor if: ?You think you are having a reaction to the medicine you are taking. ?You have headaches that keep coming back. ?You feel dizzy. ?You have swelling in your ankles. ?You have trouble with your vision. ?Get help right away if: ?You get a very bad headache. ?You start to feel mixed up (confused). ?You feel weak or numb. ?You feel faint. ?You have very bad pain in your: ?Chest. ?Belly (abdomen). ?You vomit more than once. ?You have trouble breathing. ?These symptoms may be an emergency. Get help right away. Call 911. ?Do not wait to see if the symptoms will go away. ?Do not drive yourself to the hospital. ?Summary ?Hypertension is another name for high blood pressure. ?High blood pressure forces your heart to work harder to pump blood. ?For most people, a normal blood pressure is less than 120/80. ?Making healthy choices can help lower blood pressure. If your blood pressure does not get lower with healthy choices, you may need to take medicine. ?This information is not intended to replace advice given to you by your health care provider. Make sure you discuss any questions you have with your health care provider. ?Document Revised: 02/07/2021 Document Reviewed: 02/07/2021 ?Elsevier Patient Education ? 2023 Elsevier Inc. ? ?

## 2022-05-28 ENCOUNTER — Encounter: Payer: Self-pay | Admitting: Internal Medicine

## 2022-05-28 ENCOUNTER — Ambulatory Visit: Payer: Medicare PPO | Admitting: Internal Medicine

## 2022-05-28 VITALS — BP 130/70 | HR 77 | Temp 98.0°F | Ht 64.0 in | Wt 231.6 lb

## 2022-05-28 DIAGNOSIS — N182 Chronic kidney disease, stage 2 (mild): Secondary | ICD-10-CM | POA: Diagnosis not present

## 2022-05-28 DIAGNOSIS — I129 Hypertensive chronic kidney disease with stage 1 through stage 4 chronic kidney disease, or unspecified chronic kidney disease: Secondary | ICD-10-CM

## 2022-05-28 DIAGNOSIS — R7309 Other abnormal glucose: Secondary | ICD-10-CM

## 2022-05-28 DIAGNOSIS — Z6839 Body mass index (BMI) 39.0-39.9, adult: Secondary | ICD-10-CM | POA: Diagnosis not present

## 2022-05-28 DIAGNOSIS — E7800 Pure hypercholesterolemia, unspecified: Secondary | ICD-10-CM

## 2022-05-28 DIAGNOSIS — R202 Paresthesia of skin: Secondary | ICD-10-CM

## 2022-05-28 MED ORDER — ATORVASTATIN CALCIUM 80 MG PO TABS: ORAL_TABLET | ORAL | 2 refills | Status: DC

## 2022-05-28 MED ORDER — LISINOPRIL-HYDROCHLOROTHIAZIDE 20-12.5 MG PO TABS: 1.0000 | ORAL_TABLET | Freq: Every day | ORAL | 2 refills | Status: DC

## 2022-05-29 LAB — LIPID PANEL
Chol/HDL Ratio: 2.9 ratio (ref 0.0–4.4)
Cholesterol, Total: 241 mg/dL — ABNORMAL HIGH (ref 100–199)
HDL: 83 mg/dL (ref >39)
LDL Chol Calc (NIH): 144 mg/dL — ABNORMAL HIGH (ref 0–99)
Triglycerides: 83 mg/dL (ref 0–149)
VLDL Cholesterol Cal: 14 mg/dL (ref 5–40)

## 2022-05-29 LAB — CMP14+EGFR
ALT: 15 IU/L (ref 0–32)
AST: 15 IU/L (ref 0–40)
Albumin/Globulin Ratio: 1.6 (ref 1.2–2.2)
Albumin: 4.2 g/dL (ref 3.8–4.8)
Alkaline Phosphatase: 77 IU/L (ref 44–121)
BUN/Creatinine Ratio: 13 (ref 12–28)
BUN: 14 mg/dL (ref 8–27)
Bilirubin Total: 0.6 mg/dL (ref 0.0–1.2)
CO2: 27 mmol/L (ref 20–29)
Calcium: 9.9 mg/dL (ref 8.7–10.3)
Chloride: 99 mmol/L (ref 96–106)
Creatinine, Ser: 1.05 mg/dL — ABNORMAL HIGH (ref 0.57–1.00)
Globulin, Total: 2.6 g/dL (ref 1.5–4.5)
Glucose: 106 mg/dL — ABNORMAL HIGH (ref 70–99)
Potassium: 4.6 mmol/L (ref 3.5–5.2)
Sodium: 142 mmol/L (ref 134–144)
Total Protein: 6.8 g/dL (ref 6.0–8.5)
eGFR: 56 mL/min/{1.73_m2} — ABNORMAL LOW (ref >59)

## 2022-05-29 LAB — HEMOGLOBIN A1C
Est. average glucose Bld gHb Est-mCnc: 131 mg/dL
Hgb A1c MFr Bld: 6.2 % — ABNORMAL HIGH (ref 4.8–5.6)

## 2022-06-01 ENCOUNTER — Encounter: Payer: Self-pay | Admitting: Internal Medicine

## 2022-06-05 ENCOUNTER — Ambulatory Visit (INDEPENDENT_AMBULATORY_CARE_PROVIDER_SITE_OTHER): Payer: Medicare PPO

## 2022-06-05 VITALS — BP 128/70 | HR 90 | Temp 97.7°F | Ht 64.2 in | Wt 232.6 lb

## 2022-06-05 DIAGNOSIS — Z0000 Encounter for general adult medical examination without abnormal findings: Secondary | ICD-10-CM | POA: Diagnosis not present

## 2022-06-05 NOTE — Progress Notes (Signed)
Subjective:   Gabrielle [name] is a 77 y.o. female who presents for Medicare Annual (Subsequent) preventive examination.  Review of Systems     Cardiac Risk Factors include: advanced age (>34men, >65 women);hypertension;obesity (BMI >30kg/m2)     Objective:    Today's Vitals   06/05/22 1038  BP: 128/70  Pulse: 90  Temp: 97.7 F (36.5 C)  TempSrc: Oral  SpO2: 96%  Weight: 232 lb 9.6 oz (105.5 kg)  Height: 5' 4.2" (1.631 m)   Body mass index is 39.68 kg/m.     06/05/2022   10:47 AM 05/23/2021   11:44 AM 05/16/2020   11:13 AM 05/11/2019   12:13 PM 11/09/2018    3:37 PM 03/03/2018    9:51 AM  Advanced Directives  Does Patient Have a Medical Advance Directive? Yes Yes Yes Yes Yes Yes  Type of Paramedic of [place];Living will Healthcare Power of [place];Living will [place];Living will Living will Living will  Does patient want to make changes to medical advance directive?      No - Patient declined  Copy of [place] in Chart? No - copy requested No - copy requested No - copy requested No - copy requested      Current Medications (verified) Outpatient Encounter Medications as of 06/05/2022  Medication Sig   aspirin 81 MG tablet Take 81 mg by mouth daily.   atorvastatin (LIPITOR) 80 MG tablet TAKE 1 TABLET BY MOUTH ONCE DAILY ON MONDAYS THROUGH SATURDAYS   b complex vitamins capsule Take 1 capsule by mouth daily.   Cholecalciferol (DIALYVITE VITAMIN D 5000 PO) Take by mouth daily.   lisinopril-hydrochlorothiazide (ZESTORETIC) 20-12.5 MG tablet Take 1 tablet by mouth daily.   Multiple Vitamins-Minerals (MULTIVITAMIN PO) Take 1 tablet by mouth daily.    No facility-administered encounter medications on file as of 06/05/2022.    Allergies (verified) Patient has no known allergies.   History: Past Medical History:  Diagnosis Date   Hemorrhoids    Hypertension    Obesity    Past  Surgical History:  Procedure Laterality Date   BREAST EXCISIONAL BIOPSY Left 1991   BREAST SURGERY     CATARACT EXTRACTION Bilateral    01/18/2021 and 02/22/2021   DILATATION AND CURETTAGE/HYSTEROSCOPY WITH MINERVA     HYSTEROSCOPY WITH D & C  02/07/2022   LAPAROSCOPIC GASTRIC BANDING  2009   TUBAL LIGATION     Family History  Problem Relation Age of Onset   COPD Mother        colon   Heart disease Father    Heart disease Brother    Social History   Socioeconomic History   Marital status: Single    Spouse name: Not on file   Number of children: Not on file   Years of education: Not on file   Highest education level: Not on file  Occupational History   Occupation: retired  Tobacco Use   Smoking status: Never   Smokeless tobacco: Never  Vaping Use   Vaping Use: Never used  Substance and Sexual Activity   Alcohol use: No   Drug use: No   Sexual activity: Not Currently  Other Topics Concern   Not on file  Social History Narrative   Not on file   Social Determinants of Health   Financial Resource Strain: Low Risk  (06/05/2022)   Overall Financial Resource Strain (CARDIA)    Difficulty of Paying Living Expenses: Not  hard at all  Food Insecurity: No Food Insecurity (06/05/2022)   Hunger Vital Sign    Worried About Running Out of Food in the Last Year: Never true    Ran Out of Food in the Last Year: Never true  Transportation Needs: No Transportation Needs (06/05/2022)   PRAPARE - Hydrologist (Medical): No    Lack of Transportation (Non-Medical): No  Physical Activity: Insufficiently Active (06/05/2022)   Exercise Vital Sign    Days of Exercise per Week: 3 days    Minutes of Exercise per Session: 40 min  Stress: No Stress Concern Present (06/05/2022)   [place]    Feeling of Stress : Not at all  Social Connections: Not on file    Tobacco Counseling Counseling given: Not  Answered   Clinical Intake:  Pre-visit preparation completed: Yes  Pain : No/denies pain     Nutritional Status: BMI > 30  Obese Nutritional Risks: None Diabetes: No  How often do you need to have someone help you when you read instructions, pamphlets, or other written materials from your doctor or pharmacy?: 1 - Never  Diabetic? no  Interpreter Needed?: No  Information entered by :: NAllen LPN   Activities of Daily Living    06/05/2022   10:47 AM  In your present state of health, do you have any difficulty performing the following activities:  Hearing? 0  Vision? 0  Difficulty concentrating or making decisions? 0  Walking or climbing stairs? 0  Dressing or bathing? 0  Doing errands, shopping? 0  Preparing Food and eating ? N  Using the Toilet? N  In the past six months, have you accidently leaked urine? N  Do you have problems with loss of bowel control? N  Managing your Medications? N  Managing your Finances? N  Housekeeping or managing your Housekeeping? N    Patient Care Team: Glendale Chard, MD as PCP - General (Internal Medicine) Warden Fillers, MD as Consulting Physician (Ophthalmology)  Indicate any recent Medical Services you may have received from other than [facility] providers in the past year (date may be approximate).     Assessment:   This is a routine wellness examination for [place].  Hearing/Vision screen Vision Screening - Comments:: Regular eye exams, [facility]  Dietary issues and exercise activities discussed: Current Exercise Habits: Structured exercise class, Type of exercise: calisthenics;stretching, Time (Minutes): 45, Frequency (Times/Week): 3, Weekly Exercise (Minutes/Week): 135   Goals Addressed             This Visit's Progress    Patient Stated       06/05/2022, stay healthy and stay mobile       Depression Screen    06/05/2022   10:47 AM 05/28/2022   10:39 AM 05/23/2021   11:45 AM 05/16/2020   11:15 AM  05/11/2019   12:13 PM 05/11/2019   11:47 AM 11/09/2018    3:38 PM  PHQ 2/9 Scores  PHQ - 2 Score 0 0 0 0 0 0 0  PHQ- 9 Score     0  0    Fall Risk    06/05/2022   10:47 AM 05/28/2022   10:39 AM 05/23/2021   11:45 AM 05/21/2021   10:23 AM 05/16/2020   11:14 AM  Fall Risk   Falls in the past year? 0 0 0 0 1  Comment     tripped over suitcase  Number falls  in past yr: 0 0   0  Injury with Fall? 0 0   0  Risk for fall due to : Medication side effect No Fall Risks Medication side effect  Medication side effect  Follow up Falls prevention discussed;Education provided;Falls evaluation completed Falls evaluation completed Falls evaluation completed;Education provided;Falls prevention discussed  Falls evaluation completed;Education provided;Falls prevention discussed    FALL RISK PREVENTION PERTAINING TO THE HOME:  Any stairs in or around the home? Yes  If so, are there any without handrails? No  Home free of loose throw rugs in walkways, pet beds, electrical cords, etc? Yes  Adequate lighting in your home to reduce risk of falls? Yes   ASSISTIVE DEVICES UTILIZED TO PREVENT FALLS:  Life alert? No  Use of a cane, walker or w/c? No  Grab bars in the bathroom? No  Shower chair or bench in shower? No  Elevated toilet seat or a handicapped toilet? No   TIMED UP AND GO:  Was the test performed? Yes .  Length of time to ambulate 10 feet: 5 sec.   Gait steady and fast without use of assistive device  Cognitive Function:        06/05/2022   10:48 AM 05/23/2021   11:46 AM 05/16/2020   11:16 AM 05/11/2019   12:15 PM 11/09/2018    3:40 PM  6CIT Screen  What Year? 0 points 0 points 0 points 0 points 0 points  What month? 0 points 0 points 0 points 0 points 0 points  What time? 0 points 0 points 0 points 0 points 0 points  Count back from 20 0 points 0 points 0 points 0 points 0 points  Months in reverse 0 points 0 points 0 points 0 points 0 points  Repeat phrase 2 points 2 points 0 points 0  points 0 points  Total Score 2 points 2 points 0 points 0 points 0 points    Immunizations Immunization History  Administered Date(s) Administered   Fluad Quad(high Dose 65+) 01/01/2019, 02/09/2021   Influenza, High Dose Seasonal PF 03/03/2018   Influenza,inj,Quad PF,6+ Mos 01/01/2019   Influenza-Unspecified 01/24/2020, 02/14/2022   Moderna Covid Bivalent Peds Booster(76mo Thru 45yrs) 02/14/2022   Moderna Covid-19 Vaccine Bivalent Booster 69yrs & up 02/09/2021   Moderna Sars-Covid-2 Vaccination 06/10/2019, 07/13/2019, 03/11/2020, 09/24/2020   Pneumococcal Conjugate-13 07/07/2018, 07/07/2018   Pneumococcal Polysaccharide-23 05/16/2020   Tdap 01/10/2014   Zoster Recombinat (Shingrix) 10/23/2020, 10/02/2021    TDAP status: Up to date  Flu Vaccine status: Up to date  Pneumococcal vaccine status: Up to date  Covid-19 vaccine status: Completed vaccines  Qualifies for Shingles Vaccine? Yes   Zostavax completed Yes   Shingrix Completed?: Yes  Screening Tests Health Maintenance  Topic Date Due   COVID-19 Vaccine (7 - 2023-24 season) 04/11/2022   Medicare Annual Wellness (AWV)  06/06/2023   MAMMOGRAM  11/14/2023   DTaP/Tdap/Td (2 - Td or Tdap) 01/11/2024   COLONOSCOPY (Pts 45-22yrs Insurance coverage will need to be confirmed)  11/14/2025   Pneumonia Vaccine 32+ Years old  Completed   INFLUENZA VACCINE  Completed   DEXA SCAN  Completed   Hepatitis C Screening  Completed   Zoster Vaccines- Shingrix  Completed   HPV VACCINES  Aged Out    Health Maintenance  Health Maintenance Due  Topic Date Due   COVID-19 Vaccine (7 - 2023-24 season) 04/11/2022    Colorectal cancer screening: Type of screening: Colonoscopy. Completed 11/14/2020. Repeat every 5 years  Mammogram status:  Completed 11/13/2021. Repeat every year  Bone Density status: Completed 03/02/2007.   Lung Cancer Screening: (Low Dose CT Chest recommended if Age 75-80 years, 30 pack-year currently smoking OR have quit  w/in 15years.) does not qualify.   Lung Cancer Screening Referral: no  Additional Screening:  Hepatitis C Screening: does qualify; Completed 01/29/2016  Vision Screening: Recommended annual ophthalmology exams for early detection of glaucoma and other disorders of the eye. Is the patient up to date with their annual eye exam?  Yes  Who is the provider or what is the name of the office in which the patient attends annual eye exams? [facility] If pt is not established with a provider, would they like to be referred to a provider to establish care? No .   Dental Screening: Recommended annual dental exams for proper oral hygiene  Community Resource Referral / Chronic Care Management: CRR required this visit?  No   CCM required this visit?  No      Plan:     I have personally reviewed and noted the following in the patient's chart:   Medical and social history Use of alcohol, tobacco or illicit drugs  Current medications and supplements including opioid prescriptions. Patient is not currently taking opioid prescriptions. Functional ability and status Nutritional status Physical activity Advanced directives List of other physicians Hospitalizations, surgeries, and ER visits in previous 12 months Vitals Screenings to include cognitive, depression, and falls Referrals and appointments  In addition, I have reviewed and discussed with patient certain preventive protocols, quality metrics, and best practice recommendations. A written personalized care plan for preventive services as well as general preventive health recommendations were provided to patient.     Kellie Simmering, LPN   12/03/8561   Nurse Notes: none

## 2022-06-05 NOTE — Patient Instructions (Signed)
Gabrielle [name] , Thank you for taking time to come for your Medicare Wellness Visit. I appreciate your ongoing commitment to your health goals. Please review the following plan we discussed and let me know if I can assist you in the future.   These are the goals we discussed:  Goals       Patient Stated      05/11/2019, wants to get back into the gym once she gets covid vaccine      Patient Stated      05/16/2020, stay in best health she can      Patient Stated      05/23/2021, stay active and stay healthy      Patient Stated      06/05/2022, stay healthy and stay mobile      Weight (lb) < 200 lb (90.7 kg) (pt-stated)      Would like to lose 8 pounds gained over last year        This is a list of the screening recommended for you and due dates:  Health Maintenance  Topic Date Due   COVID-19 Vaccine (7 - 2023-24 season) 04/11/2022   Medicare Annual Wellness Visit  06/06/2023   Mammogram  11/14/2023   DTaP/Tdap/Td vaccine (2 - Td or Tdap) 01/11/2024   Colon Cancer Screening  11/14/2025   Pneumonia Vaccine  Completed   Flu Shot  Completed   DEXA scan (bone density measurement)  Completed   Hepatitis C Screening: USPSTF Recommendation to screen - Ages 98-79 yo.  Completed   Zoster (Shingles) Vaccine  Completed   HPV Vaccine  Aged Out    Advanced directives: Please bring a copy of your POA (Power of Val[place]) and/or Living Will to your next appointment.   Conditions/risks identified: none  Next appointment: Follow up in one year for your annual wellness visit    Preventive Care 65 Years and Older, Female Preventive care refers to lifestyle choices and visits with your health care provider that can promote health and wellness. What does preventive care include? A yearly physical exam. This is also called an annual well check. Dental exams once or twice a year. Routine eye exams. Ask your health care provider how often you should have your eyes checked. Personal lifestyle choices,  including: Daily care of your teeth and gums. Regular physical activity. Eating a healthy diet. Avoiding tobacco and drug use. Limiting alcohol use. Practicing safe sex. Taking low-dose aspirin every day. Taking vitamin and mineral supplements as recommended by your health care provider. What happens during an annual well check? The services and screenings done by your health care provider during your annual well check will depend on your age, overall health, lifestyle risk factors, and family history of disease. Counseling  Your health care provider may ask you questions about your: Alcohol use. Tobacco use. Drug use. Emotional well-being. Home and relationship well-being. Sexual activity. Eating habits. History of falls. Memory and ability to understand (cognition). Work and work Statistician. Reproductive health. Screening  You may have the following tests or measurements: Height, weight, and BMI. Blood pressure. Lipid and cholesterol levels. These may be checked every 5 years, or more frequently if you are over 1 years old. Skin check. Lung cancer screening. You may have this screening every year starting at age 41 if you have a 30-pack-year history of smoking and currently smoke or have quit within the past 15 years. Fecal occult blood test (FOBT) of the stool. You may have this test every  year starting at age 42. Flexible sigmoidoscopy or colonoscopy. You may have a sigmoidoscopy every 5 years or a colonoscopy every 10 years starting at age 103. Hepatitis C blood test. Hepatitis B blood test. Sexually transmitted disease (STD) testing. Diabetes screening. This is done by checking your blood sugar (glucose) after you have not eaten for a while (fasting). You may have this done every 1-3 years. Bone density scan. This is done to screen for osteoporosis. You may have this done starting at age 60. Mammogram. This may be done every 1-2 years. Talk to your health care provider  about how often you should have regular mammograms. Talk with your health care provider about your test results, treatment options, and if necessary, the need for more tests. Vaccines  Your health care provider may recommend certain vaccines, such as: Influenza vaccine. This is recommended every year. Tetanus, diphtheria, and acellular pertussis (Tdap, Td) vaccine. You may need a Td booster every 10 years. Zoster vaccine. You may need this after age 5. Pneumococcal 13-valent conjugate (PCV13) vaccine. One dose is recommended after age 13. Pneumococcal polysaccharide (PPSV23) vaccine. One dose is recommended after age 84. Talk to your health care provider about which screenings and vaccines you need and how often you need them. This information is not intended to replace advice given to you by your health care provider. Make sure you discuss any questions you have with your health care provider. Document Released: 05/18/2015 Document Revised: 01/09/2016 Document Reviewed: 02/20/2015 Elsevier Interactive Patient Education  2017 [employer] Prevention in the Home Falls can cause injuries. They can happen to people of all ages. There are many things you can do to make your home safe and to help prevent falls. What can I do on the outside of my home? Regularly fix the edges of walkways and driveways and fix any cracks. Remove anything that might make you trip as you walk through a door, such as a raised step or threshold. Trim any bushes or trees on the path to your home. Use bright outdoor lighting. Clear any walking paths of anything that might make someone trip, such as rocks or tools. Regularly check to see if handrails are loose or broken. Make sure that both sides of any steps have handrails. Any raised decks and porches should have guardrails on the edges. Have any leaves, snow, or ice cleared regularly. Use sand or salt on walking paths during winter. Clean up any spills in  your garage right away. This includes oil or grease spills. What can I do in the bathroom? Use night lights. Install grab bars by the toilet and in the tub and shower. Do not use towel bars as grab bars. Use non-skid mats or decals in the tub or shower. If you need to sit down in the shower, use a plastic, non-slip stool. Keep the floor dry. Clean up any water that spills on the floor as soon as it happens. Remove soap buildup in the tub or shower regularly. Attach bath mats securely with double-sided non-slip rug tape. Do not have throw rugs and other things on the floor that can make you trip. What can I do in the bedroom? Use night lights. Make sure that you have a light by your bed that is easy to reach. Do not use any sheets or blankets that are too big for your bed. They should not hang down onto the floor. Have a firm chair that has side arms. You can use this  for support while you get dressed. Do not have throw rugs and other things on the floor that can make you trip. What can I do in the kitchen? Clean up any spills right away. Avoid walking on wet floors. Keep items that you use a lot in easy-to-reach places. If you need to reach something above you, use a strong step stool that has a grab bar. Keep electrical cords out of the way. Do not use floor polish or wax that makes floors slippery. If you must use wax, use non-skid floor wax. Do not have throw rugs and other things on the floor that can make you trip. What can I do with my stairs? Do not leave any items on the stairs. Make sure that there are handrails on both sides of the stairs and use them. Fix handrails that are broken or loose. Make sure that handrails are as long as the stairways. Check any carpeting to make sure that it is firmly attached to the stairs. Fix any carpet that is loose or worn. Avoid having throw rugs at the top or bottom of the stairs. If you do have throw rugs, attach them to the floor with carpet  tape. Make sure that you have a light switch at the top of the stairs and the bottom of the stairs. If you do not have them, ask someone to add them for you. What else can I do to help prevent falls? Wear shoes that: Do not have high heels. Have rubber bottoms. Are comfortable and fit you well. Are closed at the toe. Do not wear sandals. If you use a stepladder: Make sure that it is fully opened. Do not climb a closed stepladder. Make sure that both sides of the stepladder are locked into place. Ask someone to hold it for you, if possible. Clearly mark and make sure that you can see: Any grab bars or handrails. First and last steps. Where the edge of each step is. Use tools that help you move around (mobility aids) if they are needed. These include: Canes. Walkers. Scooters. Crutches. Turn on the lights when you go into a dark area. Replace any light bulbs as soon as they burn out. Set up your furniture so you have a clear path. Avoid moving your furniture around. If any of your floors are uneven, fix them. If there are any pets around you, be aware of where they are. Review your medicines with your doctor. Some medicines can make you feel dizzy. This can increase your chance of falling. Ask your doctor what other things that you can do to help prevent falls. This information is not intended to replace advice given to you by your health care provider. Make sure you discuss any questions you have with your health care provider. Document Released: 02/15/2009 Document Revised: 09/27/2015 Document Reviewed: 05/26/2014 Elsevier Interactive Patient Education  2017 [employer].

## 2022-06-21 ENCOUNTER — Telehealth: Payer: Medicare PPO | Admitting: Family Medicine

## 2022-06-21 DIAGNOSIS — H6991 Unspecified Eustachian tube disorder, right ear: Secondary | ICD-10-CM

## 2022-06-21 MED ORDER — FLUTICASONE PROPIONATE 50 MCG/ACT NA SUSP: 2.0000 | Freq: Every day | NASAL | 6 refills | Status: AC

## 2022-06-21 MED ORDER — PREDNISONE 20 MG PO TABS: 20.0000 mg | ORAL_TABLET | Freq: Two times a day (BID) | ORAL | 0 refills | Status: AC

## 2022-06-21 NOTE — Patient Instructions (Signed)
Eustachian Tube Dysfunction  Eustachian tube dysfunction refers to a condition in which a blockage develops in the narrow passage that connects the middle ear to the back of the nose (eustachian tube). The eustachian tube regulates air pressure in the middle ear by letting air move between the ear and nose. It also helps to drain fluid from the middle ear space. Eustachian tube dysfunction can affect one or both ears. When the eustachian tube does not function properly, air pressure, fluid, or both can build up in the middle ear. What are the causes? This condition occurs when the eustachian tube becomes blocked or cannot open normally. Common causes of this condition include: Ear infections. Colds and other infections that affect the nose, mouth, and throat (upper respiratory tract). Allergies. Irritation from cigarette smoke. Irritation from stomach acid coming up into the esophagus (gastroesophageal reflux). The esophagus is the part of the body that moves food from the mouth to the stomach. Sudden changes in air pressure, such as from descending in an airplane or scuba diving. Abnormal growths in the nose or throat, such as: Growths that line the nose (nasal polyps). Abnormal growth of cells (tumors). Enlarged tissue at the back of the throat (adenoids). What increases the risk? You are more likely to develop this condition if: You smoke. You are overweight. You are a child who has: Certain birth defects of the mouth, such as cleft palate. Large tonsils or adenoids. What are the signs or symptoms? Common symptoms of this condition include: A feeling of fullness in the ear. Ear pain. Clicking or popping noises in the ear. Ringing in the ear (tinnitus). Hearing loss. Loss of balance. Dizziness. Symptoms may get worse when the air pressure around you changes, such as when you travel to an area of high elevation, fly on an airplane, or go scuba diving. How is this diagnosed? This  condition may be diagnosed based on: Your symptoms. A physical exam of your ears, nose, and throat. Tests, such as those that measure: The movement of your eardrum. Your hearing (audiometry). How is this treated? Treatment depends on the cause and severity of your condition. In mild cases, you may relieve your symptoms by moving air into your ears. This is called "popping the ears." In more severe cases, or if you have symptoms of fluid in your ears, treatment may include: Medicines to relieve congestion (decongestants). Medicines that treat allergies (antihistamines). Nasal sprays or ear drops that contain medicines that reduce swelling (steroids). A procedure to drain the fluid in your eardrum. In this procedure, a small tube may be placed in the eardrum to: Drain the fluid. Restore the air in the middle ear space. A procedure to insert a balloon device through the nose to inflate the opening of the eustachian tube (balloon dilation). Follow these instructions at home: Lifestyle Do not do any of the following until your health care provider approves: Travel to high altitudes. Fly in airplanes. Work in a pressurized cabin or room. Scuba dive. Do not use any products that contain nicotine or tobacco. These products include cigarettes, chewing tobacco, and vaping devices, such as e-cigarettes. If you need help quitting, ask your health care provider. Keep your ears dry. Wear fitted earplugs during showering and bathing. Dry your ears completely after. General instructions Take over-the-counter and prescription medicines only as told by your health care provider. Use techniques to help pop your ears as recommended by your health care provider. These may include: Chewing gum. Yawning. Frequent, forceful swallowing.   Closing your mouth, holding your nose closed, and gently blowing as if you are trying to blow air out of your nose. Keep all follow-up visits. This is important. Contact a  health care provider if: Your symptoms do not go away after treatment. Your symptoms come back after treatment. You are unable to pop your ears. You have: A fever. Pain in your ear. Pain in your head or neck. Fluid draining from your ear. Your hearing suddenly changes. You become very dizzy. You lose your balance. Get help right away if: You have a sudden, severe increase in any of your symptoms. Summary Eustachian tube dysfunction refers to a condition in which a blockage develops in the eustachian tube. It can be caused by ear infections, allergies, inhaled irritants, or abnormal growths in the nose or throat. Symptoms may include ear pain or fullness, hearing loss, or ringing in the ears. Mild cases are treated with techniques to unblock the ears, such as yawning or chewing gum. More severe cases are treated with medicines or procedures. This information is not intended to replace advice given to you by your health care provider. Make sure you discuss any questions you have with your health care provider. Document Revised: 07/02/2020 Document Reviewed: 07/02/2020 Elsevier Patient Education  2023 Else[employer] Inc[employer]  

## 2022-06-21 NOTE — Progress Notes (Signed)
Virtual Visit Consent   Gabrielle [name], you are scheduled for a virtual visit with a [facility] provider today. Just as with appointments in the office, your consent must be obtained to participate. Your consent will be active for this visit and any virtual visit you may have with one of our providers in the next 365 days. If you have a MyChart account, a copy of this consent can be sent to you electronically.  As this is a virtual visit, video technology does not allow for your provider to perform a traditional examination. This may limit your provider's ability to fully assess your condition. If your provider identifies any concerns that need to be evaluated in person or the need to arrange testing (such as labs, EKG, etc.), we will make arrangements to do so. Although advances in technology are sophisticated, we cannot ensure that it will always work on either your end or our end. If the connection with a video visit is poor, the visit may have to be switched to a telephone visit. With either a video or telephone visit, we are not always able to ensure that we have a secure connection.  By engaging in this virtual visit, you consent to the provision of healthcare and authorize for your insurance to be billed (if applicable) for the services provided during this visit. Depending on your insurance coverage, you may receive a charge related to this service.  I need to obtain your verbal consent now. Are you willing to proceed with your visit today? Gabrielle [name] has provided verbal consent on 06/21/2022 for a virtual visit (video or telephone). Gabrielle Nims, FNP  Date: 06/21/2022 12:39 PM  Virtual Visit via Video Note   I, Gabrielle [name], connected with  Leafy Ro  ([id number], 30-[id number]-1945) on 06/21/22 at 12:30 PM EST by a video-enabled telemedicine application and verified that I am speaking with the correct person using two identifiers.  Location: Patient: Virtual Visit Location Patient:  Home Provider: Virtual Visit Location Provider: Home Office   I discussed the limitations of evaluation and management by telemedicine and the availability of in person appointments. The patient expressed understanding and agreed to proceed.    History of Present Illness: Gabrielle [name] is a 77 y.o. who identifies as a female who was assigned female at birth, and is being seen today for rt ear full, has tried claritin and debrox without success. Pcp could not see her until thursday. She has also been hoarse. No fever, wheezing sob or chills. Neg covid testing. 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    Allergies: No Known Allergies Medications:  Current Outpatient Medications:    aspirin 81 MG tablet, Take 81 mg by mouth daily., Disp: , Rfl:    atorvastatin (LIPITOR) 80 MG tablet, TAKE 1 TABLET BY MOUTH ONCE DAILY ON MONDAYS THROUGH SATURDAYS, Disp: 78 tablet, Rfl: 2   b complex vitamins capsule, Take 1 capsule by mouth daily., Disp: , Rfl:    Cholecalciferol (DIALYVITE VITAMIN D 5000 PO), Take by mouth daily., Disp: , Rfl:  lisinopril-hydrochlorothiazide (ZESTORETIC) 20-12.5 MG tablet, Take 1 tablet by mouth daily., Disp: 90 tablet, Rfl: 2   Multiple Vitamins-Minerals (MULTIVITAMIN PO), Take 1 tablet by mouth daily. , Disp: , Rfl:   Observations/Objective: Patient is well-developed, well-nourished in no acute distress.  Resting comfortably  at home.  Head is normocephalic, atraumatic.  No labored breathing.  Speech is clear and coherent with logical content.   Patient is alert and oriented at baseline.    Assessment and Plan: 1. Dysfunction of right eustachian tube  Increase fluids, benadryl otc, urgent care if sx worsen.   Follow Up Instructions: I discussed the assessment and treatment plan with the patient. The patient was provided an opportunity to ask questions and all were answered. The patient agreed with the plan and demonstrated an understanding of the instructions.  A copy of instructions were sent to the patient via MyChart unless otherwise noted below.     The patient was advised to call back or seek an in-person evaluation if the symptoms worsen or if the condition fails to improve as anticipated.  Time:  I spent 10 minutes with the patient via telehealth technology discussing the above problems/concerns.    Gabrielle Nims, FNP

## 2022-08-25 ENCOUNTER — Encounter: Payer: Self-pay | Admitting: Internal Medicine

## 2022-08-25 ENCOUNTER — Ambulatory Visit: Payer: Medicare PPO | Admitting: Internal Medicine

## 2022-08-25 VITALS — BP 130/82 | HR 85 | Temp 98.0°F | Ht 64.0 in | Wt 232.8 lb

## 2022-08-25 DIAGNOSIS — H6123 Impacted cerumen, bilateral: Secondary | ICD-10-CM | POA: Diagnosis not present

## 2022-08-25 DIAGNOSIS — N182 Chronic kidney disease, stage 2 (mild): Secondary | ICD-10-CM

## 2022-08-25 DIAGNOSIS — I129 Hypertensive chronic kidney disease with stage 1 through stage 4 chronic kidney disease, or unspecified chronic kidney disease: Secondary | ICD-10-CM

## 2022-08-25 DIAGNOSIS — J302 Other seasonal allergic rhinitis: Secondary | ICD-10-CM

## 2022-08-25 DIAGNOSIS — Z6839 Body mass index (BMI) 39.0-39.9, adult: Secondary | ICD-10-CM

## 2022-08-25 DIAGNOSIS — H938X1 Other specified disorders of right ear: Secondary | ICD-10-CM

## 2022-08-25 MED ORDER — MONTELUKAST SODIUM 10 MG PO TABS: 10.0000 mg | ORAL_TABLET | Freq: Every day | ORAL | 2 refills | Status: DC

## 2022-08-25 NOTE — Progress Notes (Signed)
I,Victoria T Hamilton,acting as a scribe for Gwynneth Aliment, MD.,have documented all relevant documentation on the behalf of Gwynneth Aliment, MD,as directed by  Gwynneth Aliment, MD while in the presence of Gwynneth Aliment, MD.    Subjective:     Patient ID: Gabrielle [name] , female    DOB: [birth year] , 77 y.o.   MRN: [id number]   Chief Complaint  Patient presents with   Ear Fullness    HPI  Pt presents today for right ear discomfort. She experiences: ear fullness & ear popping.  She reports this initially started in February. She reports completing an E-visit during that time. She was given 5 day course of prednisone. She reports taking one tab twice daily. Along with Flonase. She admits the medications did help.  Her symptoms returned intermittently in March. She does add that she has "bad allergies" w/ postnasal drip.   She denies being established with an ENT specialist.        Past Medical History:  Diagnosis Date   Hemorrhoids    Hypertension    Obesity      Family History  Problem Relation Age of Onset   COPD Mother        colon   Heart disease Father    Heart disease Brother      Current Outpatient Medications:    aspirin 81 MG tablet, Take 81 mg by mouth daily., Disp: , Rfl:    atorvastatin (LIPITOR) 80 MG tablet, TAKE 1 TABLET BY MOUTH ONCE DAILY ON MONDAYS THROUGH SATURDAYS, Disp: 78 tablet, Rfl: 2   b complex vitamins capsule, Take 1 capsule by mouth daily., Disp: , Rfl:    Cholecalciferol (DIALYVITE VITAMIN D 5000 PO), Take by mouth daily., Disp: , Rfl:    fluticasone (FLONASE) 50 MCG/ACT nasal spray, Place 2 sprays into both nostrils daily., Disp: 16 g, Rfl: 6   lisinopril-hydrochlorothiazide (ZESTORETIC) 20-12.5 MG tablet, Take 1 tablet by mouth daily., Disp: 90 tablet, Rfl: 2   Multiple Vitamins-Minerals (MULTIVITAMIN PO), Take 1 tablet by mouth daily. , Disp: , Rfl:    montelukast (SINGULAIR) 10 MG tablet, Take 1 tablet (10 mg total) by mouth daily.,  Disp: 30 tablet, Rfl: 2   No Known Allergies   Review of Systems  Constitutional: Negative.   HENT:  Positive for postnasal drip.   Respiratory: Negative.    Cardiovascular: Negative.   Neurological: Negative.   Psychiatric/Behavioral: Negative.       Today's Vitals   08/25/22 0832  BP: 130/82  Pulse: 85  Temp: 98 F (36.7 C)  SpO2: 98%  Weight: 232 lb 12.8 oz (105.6 kg)  Height:  (1.626 m)  PainSc: 0-No pain   Body mass index is 39.96 kg/m.  Wt Readings from Last 3 Encounters:  08/25/22 232 lb 12.8 oz (105.6 kg)  06/05/22 232 lb 9.6 oz (105.5 kg)  05/28/22 231 lb 9.6 oz (105.1 kg)    Objective:  Physical Exam Vitals and nursing note reviewed.  Constitutional:      Appearance: Normal appearance.  HENT:     Head: Normocephalic and atraumatic.     Right Ear: Ear canal and external ear normal. There is impacted cerumen.     Left Ear: Ear canal and external ear normal. There is impacted cerumen.     Nose:     Comments: Masked     Mouth/Throat:     Comments: Masked  Eyes:     Extraocular Movements: Extraocular movements intact.  Cardiovascular:     Rate and Rhythm: Normal rate and regular rhythm.     Heart sounds: Normal heart sounds.  Pulmonary:     Effort: Pulmonary effort is normal.     Breath sounds: Normal breath sounds.  Musculoskeletal:     Cervical back: Normal range of motion.  Skin:    General: Skin is warm.  Neurological:     General: No focal deficit present.     Mental Status: She is alert.  Psychiatric:        Mood and Affect: Mood normal.        Behavior: Behavior normal.      Assessment And Plan:     1. Sensation of fullness in right ear Comments: Exam revealed b/l cerumen impaction. This is likely contributing to her sx.  2. Bilateral impacted cerumen Comments: AFter obtaining verbal consent, both ears were flushed by irrigation; however, this was unsuccessful. She wll try Debrox gtt, call later in week w/ update. - Ear  Lavage  3. Seasonal allergies Comments: I will add montelukast  once daily to her current regimen. Advised to take in AM. She was given samples  4. Hypertensive nephropathy Comments: Chronic, fair control. Likely exacerbated by decongestant use. Advised to switch from Claritin-D to Zyrtec.  5. Chronic renal disease, stage II Comments: Chronic, this has been stable. Encouraged to stay well hydrated and avoid NSAIDs.  6. Class 2 severe obesity due to excess calories with serious comorbidity and body mass index (BMI) of 39.0 to 39.9 in adult Comments: She is encouraged to aim for at least 150 minutes of exercise/week, while striving for BMI<30 to decrease cardiac risk.   Patient was given opportunity to ask questions. Patient verbalized understanding of the plan and was able to repeat key elements of the plan. All questions were answered to their satisfaction.   I, Gwynneth Aliment, MD, have reviewed all documentation for this visit. The documentation on 08/25/22 for the exam, diagnosis, procedures, and orders are all accurate and complete.   IF YOU HAVE BEEN REFERRED TO A SPECIALIST, IT MAY TAKE 1-2 WEEKS TO SCHEDULE/PROCESS THE REFERRAL. IF YOU HAVE NOT HEARD FROM US/SPECIALIST IN TWO WEEKS, PLEASE GIVE [place] A CALL AT [phone].   THE PATIENT IS ENCOURAGED TO PRACTICE SOCIAL DISTANCING DUE TO THE COVID-19 PANDEMIC.

## 2022-08-25 NOTE — Patient Instructions (Signed)

## 2022-12-01 ENCOUNTER — Encounter: Payer: Medicare PPO | Admitting: Internal Medicine

## 2023-01-27 ENCOUNTER — Ambulatory Visit (INDEPENDENT_AMBULATORY_CARE_PROVIDER_SITE_OTHER): Payer: Medicare PPO | Admitting: Internal Medicine

## 2023-01-27 ENCOUNTER — Encounter: Payer: Self-pay | Admitting: Internal Medicine

## 2023-01-27 VITALS — BP 120/82 | HR 96 | Temp 98.5°F | Ht 64.0 in | Wt 218.4 lb

## 2023-01-27 DIAGNOSIS — E7800 Pure hypercholesterolemia, unspecified: Secondary | ICD-10-CM | POA: Diagnosis not present

## 2023-01-27 DIAGNOSIS — I129 Hypertensive chronic kidney disease with stage 1 through stage 4 chronic kidney disease, or unspecified chronic kidney disease: Secondary | ICD-10-CM

## 2023-01-27 DIAGNOSIS — Z0000 Encounter for general adult medical examination without abnormal findings: Secondary | ICD-10-CM

## 2023-01-27 DIAGNOSIS — H6122 Impacted cerumen, left ear: Secondary | ICD-10-CM | POA: Diagnosis not present

## 2023-01-27 DIAGNOSIS — E2839 Other primary ovarian failure: Secondary | ICD-10-CM | POA: Diagnosis not present

## 2023-01-27 DIAGNOSIS — Z6837 Body mass index (BMI) 37.0-37.9, adult: Secondary | ICD-10-CM

## 2023-01-27 DIAGNOSIS — Z23 Encounter for immunization: Secondary | ICD-10-CM

## 2023-01-27 DIAGNOSIS — R7309 Other abnormal glucose: Secondary | ICD-10-CM

## 2023-01-27 DIAGNOSIS — N182 Chronic kidney disease, stage 2 (mild): Secondary | ICD-10-CM

## 2023-01-27 DIAGNOSIS — E6609 Other obesity due to excess calories: Secondary | ICD-10-CM

## 2023-01-27 NOTE — Progress Notes (Signed)
I,Jameka J Llittleton, CMA,acting as a Neurosurgeon for Gwynneth Aliment, MD.,have documented all relevant documentation on the behalf of Gwynneth Aliment, MD,as directed by  Gwynneth Aliment, MD while in the presence of Gwynneth Aliment, MD.  Subjective:    Patient ID: Gabrielle [name] , female    DOB: Jul 29, 1945 , 77 y.o.   MRN: [id number]  Chief Complaint  Patient presents with   Annual Exam   Hypertension    HPI  The patient is here today for a physical examination.  The patient is no longer followed by GYN.  She has no specific concerns or complaints at this time.  She reports compliance with meds. She denies headaches, chest pain and shortness of breath.   Hypertension This is a chronic problem. The current episode started more than 1 year ago. The problem has been gradually improving since onset. The problem is controlled. Pertinent negatives include no blurred vision. Risk factors for coronary artery disease include dyslipidemia, post-menopausal state and obesity. Past treatments include ACE inhibitors and diuretics. The current treatment provides moderate improvement.     Past Medical History:  Diagnosis Date   Hemorrhoids    Hypertension    Obesity      Family History  Problem Relation Age of Onset   COPD Mother        colon   Heart disease Father    Heart disease Brother      Current Outpatient Medications:    aspirin 81 MG tablet, Take 81 mg by mouth daily., Disp: , Rfl:    atorvastatin (LIPITOR) 80 MG tablet, TAKE 1 TABLET BY MOUTH ONCE DAILY ON MONDAYS THROUGH SATURDAYS, Disp: 78 tablet, Rfl: 2   b complex vitamins capsule, Take 1 capsule by mouth daily., Disp: , Rfl:    Cholecalciferol (DIALYVITE VITAMIN D 5000 PO), Take by mouth daily., Disp: , Rfl:    fluticasone (FLONASE) 50 MCG/ACT nasal spray, Place 2 sprays into both nostrils daily., Disp: 16 g, Rfl: 6   lisinopril-hydrochlorothiazide (ZESTORETIC) 20-12.5 MG tablet, Take 1 tablet by mouth daily., Disp: 90 tablet, Rfl:  2   montelukast (SINGULAIR) 10 MG tablet, Take 1 tablet (10 mg total) by mouth daily., Disp: 30 tablet, Rfl: 2   Multiple Vitamins-Minerals (MULTIVITAMIN PO), Take 1 tablet by mouth daily. , Disp: , Rfl:    No Known Allergies    The patient states she uses post menopausal status for birth control. No LMP recorded. Patient is postmenopausal.. Negative for Dysmenorrhea. Negative for: breast discharge, breast lump(s), breast pain and breast self exam. Associated symptoms include abnormal vaginal bleeding. Pertinent negatives include abnormal bleeding (hematology), anxiety, decreased libido, depression, difficulty falling sleep, dyspareunia, history of infertility, nocturia, sexual dysfunction, sleep disturbances, urinary incontinence, urinary urgency, vaginal discharge and vaginal itching. Diet regular.The patient states her exercise level is    . The patient's tobacco use is:  Social History   Tobacco Use  Smoking Status Never  Smokeless Tobacco Never  . She has been exposed to passive smoke. The patient's alcohol use is:  Social History   Substance and Sexual Activity  Alcohol Use No    Review of Systems  Constitutional: Negative.   HENT: Negative.    Eyes: Negative.  Negative for blurred vision.  Respiratory: Negative.    Cardiovascular: Negative.   Gastrointestinal: Negative.   Endocrine: Negative.   Genitourinary: Negative.   Musculoskeletal: Negative.   Skin: Negative.   Allergic/Immunologic: Negative.   Neurological: Negative.   Hematological: Negative.  Psychiatric/Behavioral: Negative.       Today's Vitals   01/27/23 1513 01/27/23 1546  BP: 130/86 120/82  Pulse: 96   Temp: 98.5 F (36.9 C)   Weight: 218 lb 6.4 oz (99.1 kg)   Height: 5\' 4"  (1.626 m)   PainSc: 0-No pain    Body mass index is 37.49 kg/m.  Wt Readings from Last 3 Encounters:  01/27/23 218 lb 6.4 oz (99.1 kg)  08/25/22 232 lb 12.8 oz (105.6 kg)  06/05/22 232 lb 9.6 oz (105.5 kg)      Objective:  Physical Exam Vitals and nursing note reviewed.  Constitutional:      Appearance: Normal appearance.  HENT:     Head: Normocephalic and atraumatic.     Right Ear: Tympanic membrane, ear canal and external ear normal.     Left Ear: Ear canal and external ear normal. There is impacted cerumen.     Nose: Nose normal.     Mouth/Throat:     Mouth: Mucous membranes are moist.     Pharynx: Oropharynx is clear.  Eyes:     Extraocular Movements: Extraocular movements intact.     Conjunctiva/sclera: Conjunctivae normal.     Pupils: Pupils are equal, round, and reactive to light.  Cardiovascular:     Rate and Rhythm: Normal rate and regular rhythm.     Pulses: Normal pulses.     Heart sounds: Normal heart sounds.  Pulmonary:     Effort: Pulmonary effort is normal.     Breath sounds: Normal breath sounds.  Chest:  Breasts:    Tanner Score is 5.     Right: Normal.     Left: Normal.  Abdominal:     General: Bowel sounds are normal.     Palpations: Abdomen is soft.  Genitourinary:    Comments: deferred Musculoskeletal:        General: Normal range of motion.     Cervical back: Normal range of motion and neck supple.  Skin:    General: Skin is warm and dry.  Neurological:     General: No focal deficit present.     Mental Status: She is alert and oriented to person, place, and time.  Psychiatric:        Mood and Affect: Mood normal.        Behavior: Behavior normal.         Assessment And Plan:     Encounter for general adult medical examination w/o abnormal findings Assessment & Plan: A full exam was performed.  Importance of monthly self breast exams was discussed with the patient.  She is advised to get 30-45 minutes of regular exercise, no less than four to five days per week. Both weight-bearing and aerobic exercises are recommended.  She is advised to follow a healthy diet with at least six fruits/veggies per day, decrease intake of red meat and other  saturated fats and to increase fish intake to twice weekly.  Meats/fish should not be fried -- baked, boiled or broiled is preferable. It is also important to cut back on your sugar intake.  Be sure to read labels - try to avoid anything with added sugar, high fructose corn syrup or other sweeteners.  If you must use a sweetener, you can try stevia or monkfruit.  It is also important to avoid artificially sweetened foods/beverages and diet drinks. Lastly, wear SPF 50 sunscreen on exposed skin and when in direct sunlight for an extended period of time.  Be sure  to avoid fast food restaurants and aim for at least 60 ounces of water daily.       Hypertensive nephropathy Assessment & Plan: Chronic, slight diastolic elevation. Goal BP<120/80 . EKG performed, NSR w/o acute changes.  She will continue with lisinopril/hydrochlorothiazide 20/12.5mg  daily. Encouraged to follow low sodium diet. She will f/u in four to six months for re-evaluation.   Orders: -     EKG 12-Lead -     CMP14+EGFR -     CBC -     Lipid panel -     Hemoglobin A1c  Chronic renal disease, stage II Assessment & Plan: Chronic, she is encouraged to keep BP well controlled and to stay well hydrated to decrease risk of CKD progression.    Orders: -     CMP14+EGFR -     CBC  Hypercholesterolemia Assessment & Plan: Chronic, currently on atorvastatin 80mg  M-Saturdays. Encouraged to follow heart healthy lifestyle aiming for at least 150 minutes of exercise/week, avoiding fried foods and aiming for at least 7-8 hours of sleep nightly.   Orders: -     CBC  Left ear impacted cerumen Assessment & Plan: She declined ear lavage.  She plans to use OTC Debrox drops.    Estrogen deficiency -     DG Bone Density; Future  Other abnormal glucose Assessment & Plan: Previous labs reviewed, her A1c has been elevated in the past. I will check an A1c today. Reminded to avoid refined sugars including sugary drinks/foods and processed meats  including bacon, sausages and deli meats.    Orders: -     Hemoglobin A1c  Class 2 severe obesity due to excess calories with serious comorbidity and body mass index (BMI) of 37.0 to 37.9 in adult [facility]) Assessment & Plan: She was congratulated on her 14 lb weight loss since April 2024. She is encouraged to strive for BMI less than 30 to decrease cardiac risk. Advised to aim for at least 150 minutes of exercise per week.       Return for 1 year physical, 6 month bp. Patient was given opportunity to ask questions. Patient verbalized understanding of the plan and was able to repeat key elements of the plan. All questions were answered to their satisfaction.   I, Gwynneth Aliment, MD, have reviewed all documentation for this visit. The documentation on 01/31/23 for the exam, diagnosis, procedures, and orders are all accurate and complete.

## 2023-01-27 NOTE — Patient Instructions (Addendum)

## 2023-01-28 LAB — CMP14+EGFR
ALT: 21 IU/L (ref 0–32)
AST: 20 IU/L (ref 0–40)
Albumin: 4.2 g/dL (ref 3.8–4.8)
Alkaline Phosphatase: 83 IU/L (ref 44–121)
BUN/Creatinine Ratio: 11 — ABNORMAL LOW (ref 12–28)
BUN: 12 mg/dL (ref 8–27)
Bilirubin Total: 0.6 mg/dL (ref 0.0–1.2)
CO2: 25 mmol/L (ref 20–29)
Calcium: 10 mg/dL (ref 8.7–10.3)
Chloride: 98 mmol/L (ref 96–106)
Creatinine, Ser: 1.09 mg/dL — ABNORMAL HIGH (ref 0.57–1.00)
Globulin, Total: 2.7 g/dL (ref 1.5–4.5)
Glucose: 96 mg/dL (ref 70–99)
Potassium: 4.4 mmol/L (ref 3.5–5.2)
Sodium: 140 mmol/L (ref 134–144)
Total Protein: 6.9 g/dL (ref 6.0–8.5)
eGFR: 53 mL/min/{1.73_m2} — ABNORMAL LOW (ref >59)

## 2023-01-28 LAB — CBC
Hematocrit: 39.9 % (ref 34.0–46.6)
Hemoglobin: 12.7 g/dL (ref 11.1–15.9)
MCH: 29.1 pg (ref 26.6–33.0)
MCHC: 31.8 g/dL (ref 31.5–35.7)
MCV: 91 fL (ref 79–97)
Platelets: 387 10*3/uL (ref 150–450)
RBC: 4.37 x10E6/uL (ref 3.77–5.28)
RDW: 13.3 % (ref 11.7–15.4)
WBC: 9.7 10*3/uL (ref 3.4–10.8)

## 2023-01-28 LAB — LIPID PANEL
Chol/HDL Ratio: 3.2 ratio (ref 0.0–4.4)
Cholesterol, Total: 249 mg/dL — ABNORMAL HIGH (ref 100–199)
HDL: 79 mg/dL (ref >39)
LDL Chol Calc (NIH): 150 mg/dL — ABNORMAL HIGH (ref 0–99)
Triglycerides: 114 mg/dL (ref 0–149)
VLDL Cholesterol Cal: 20 mg/dL (ref 5–40)

## 2023-01-28 LAB — HEMOGLOBIN A1C
Est. average glucose Bld gHb Est-mCnc: 128 mg/dL
Hgb A1c MFr Bld: 6.1 % — ABNORMAL HIGH (ref 4.8–5.6)

## 2023-01-29 ENCOUNTER — Other Ambulatory Visit: Payer: Self-pay | Admitting: Internal Medicine

## 2023-01-29 DIAGNOSIS — N1831 Chronic kidney disease, stage 3a: Secondary | ICD-10-CM

## 2023-01-31 DIAGNOSIS — Z0000 Encounter for general adult medical examination without abnormal findings: Secondary | ICD-10-CM | POA: Insufficient documentation

## 2023-01-31 DIAGNOSIS — E2839 Other primary ovarian failure: Secondary | ICD-10-CM | POA: Insufficient documentation

## 2023-01-31 DIAGNOSIS — H6122 Impacted cerumen, left ear: Secondary | ICD-10-CM | POA: Insufficient documentation

## 2023-01-31 NOTE — Assessment & Plan Note (Signed)
Chronic, currently on atorvastatin 80mg  M-Saturdays. Encouraged to follow heart healthy lifestyle aiming for at least 150 minutes of exercise/week, avoiding fried foods and aiming for at least 7-8 hours of sleep nightly.

## 2023-01-31 NOTE — Assessment & Plan Note (Signed)
She declined ear lavage.  She plans to use OTC Debrox drops.

## 2023-01-31 NOTE — Assessment & Plan Note (Signed)

## 2023-01-31 NOTE — Assessment & Plan Note (Addendum)
She was congratulated on her 14 lb weight loss since April 2024. She is encouraged to strive for BMI less than 30 to decrease cardiac risk. Advised to aim for at least 150 minutes of exercise per week.

## 2023-01-31 NOTE — Assessment & Plan Note (Signed)
Previous labs reviewed, her A1c has been elevated in the past. I will check an A1c today. Reminded to avoid refined sugars including sugary drinks/foods and processed meats including bacon, sausages and deli meats.  

## 2023-01-31 NOTE — Assessment & Plan Note (Addendum)
Chronic, slight diastolic elevation. Goal BP<120/80 . EKG performed, NSR w/o acute changes.  She will continue with lisinopril/hydrochlorothiazide 20/12.5mg  daily. Encouraged to follow low sodium diet. She will f/u in four to six months for re-evaluation.

## 2023-01-31 NOTE — Assessment & Plan Note (Signed)
Chronic, she is encouraged to keep BP well controlled and to stay well hydrated to decrease risk of CKD progression.

## 2023-02-12 ENCOUNTER — Other Ambulatory Visit: Payer: Self-pay | Admitting: Internal Medicine

## 2023-02-12 DIAGNOSIS — Z0000 Encounter for general adult medical examination without abnormal findings: Secondary | ICD-10-CM

## 2023-02-18 ENCOUNTER — Ambulatory Visit
Admission: RE | Admit: 2023-02-18 | Discharge: 2023-02-18 | Disposition: A | Discharge status: Home / Self Care | Payer: Medicare PPO | Source: Ambulatory Visit | Attending: Internal Medicine | Admitting: Internal Medicine

## 2023-02-18 DIAGNOSIS — Z0000 Encounter for general adult medical examination without abnormal findings: Secondary | ICD-10-CM

## 2023-02-18 DIAGNOSIS — Z1231 Encounter for screening mammogram for malignant neoplasm of breast: Secondary | ICD-10-CM | POA: Diagnosis not present

## 2023-02-25 DIAGNOSIS — Z961 Presence of intraocular lens: Secondary | ICD-10-CM | POA: Diagnosis not present

## 2023-02-25 DIAGNOSIS — H04123 Dry eye syndrome of bilateral lacrimal glands: Secondary | ICD-10-CM | POA: Diagnosis not present

## 2023-02-25 DIAGNOSIS — H40013 Open angle with borderline findings, low risk, bilateral: Secondary | ICD-10-CM | POA: Diagnosis not present

## 2023-03-11 ENCOUNTER — Other Ambulatory Visit: Payer: Self-pay

## 2023-05-19 ENCOUNTER — Other Ambulatory Visit: Payer: Self-pay | Admitting: Internal Medicine

## 2023-05-19 DIAGNOSIS — I129 Hypertensive chronic kidney disease with stage 1 through stage 4 chronic kidney disease, or unspecified chronic kidney disease: Secondary | ICD-10-CM

## 2023-07-22 ENCOUNTER — Other Ambulatory Visit: Payer: Self-pay | Admitting: Internal Medicine

## 2023-07-22 DIAGNOSIS — E7800 Pure hypercholesterolemia, unspecified: Secondary | ICD-10-CM

## 2023-08-03 ENCOUNTER — Ambulatory Visit: Payer: Medicare PPO | Admitting: Internal Medicine

## 2023-08-05 ENCOUNTER — Ambulatory Visit: Payer: Medicare PPO | Admitting: Internal Medicine

## 2023-08-05 ENCOUNTER — Encounter: Payer: Self-pay | Admitting: Internal Medicine

## 2023-08-05 VITALS — BP 150/90 | Temp 97.8°F | Ht 64.0 in | Wt 220.6 lb

## 2023-08-05 DIAGNOSIS — I129 Hypertensive chronic kidney disease with stage 1 through stage 4 chronic kidney disease, or unspecified chronic kidney disease: Secondary | ICD-10-CM | POA: Diagnosis not present

## 2023-08-05 DIAGNOSIS — E2839 Other primary ovarian failure: Secondary | ICD-10-CM

## 2023-08-05 DIAGNOSIS — N1831 Chronic kidney disease, stage 3a: Secondary | ICD-10-CM | POA: Diagnosis not present

## 2023-08-05 DIAGNOSIS — E66812 Obesity, class 2: Secondary | ICD-10-CM

## 2023-08-05 DIAGNOSIS — Z6837 Body mass index (BMI) 37.0-37.9, adult: Secondary | ICD-10-CM

## 2023-08-05 DIAGNOSIS — R7309 Other abnormal glucose: Secondary | ICD-10-CM | POA: Diagnosis not present

## 2023-08-05 DIAGNOSIS — E7800 Pure hypercholesterolemia, unspecified: Secondary | ICD-10-CM | POA: Diagnosis not present

## 2023-08-05 MED ORDER — MONTELUKAST SODIUM 10 MG PO TABS: 10.0000 mg | ORAL_TABLET | Freq: Every day | ORAL | 2 refills | Status: AC

## 2023-08-05 NOTE — Progress Notes (Signed)
 I,Victoria T Deloria Lair, CMA,acting as a Neurosurgeon for Gwynneth Aliment, MD.,have documented all relevant documentation on the behalf of Gwynneth Aliment, MD,as directed by  Gwynneth Aliment, MD while in the presence of Gwynneth Aliment, MD.  Subjective:  Patient ID: Gabrielle [name] , female    DOB: 29-Oct-1945 , 78 y.o.   MRN: [id number]  Chief Complaint  Patient presents with   Hypertension   Hyperlipidemia    HPI  Pt presents today for bp check. Patient reports compliance with her meds.  She denies having any headaches, chest pain and shortness of breath. She has no specific concerns or complaints at this time.     Hypertension This is a chronic problem. The current episode started more than 1 year ago. The problem has been gradually improving since onset. The problem is controlled. Pertinent negatives include no blurred vision, chest pain, palpitations or shortness of breath. Risk factors for coronary artery disease include sedentary lifestyle, post-menopausal state and obesity. Hypertensive end-organ damage includes kidney disease.     Past Medical History:  Diagnosis Date   Hemorrhoids    Hypertension    Obesity      Family History  Problem Relation Age of Onset   COPD Mother        colon   Heart disease Father    Heart disease Brother      Current Outpatient Medications:    aspirin 81 MG tablet, Take 81 mg by mouth daily., Disp: , Rfl:    atorvastatin (LIPITOR) 80 MG tablet, TAKE 1 TABLET BY MOUTH ONCE DAILY, ON MONDAY THROUGH SATURDAY, Disp: 78 tablet, Rfl: 2   b complex vitamins capsule, Take 1 capsule by mouth daily., Disp: , Rfl:    Cholecalciferol (DIALYVITE VITAMIN D 5000 PO), Take by mouth daily., Disp: , Rfl:    fluticasone (FLONASE) 50 MCG/ACT nasal spray, Place 2 sprays into both nostrils daily., Disp: 16 g, Rfl: 6   lisinopril-hydrochlorothiazide (ZESTORETIC) 20-12.5 MG tablet, TAKE 1 TABLET BY MOUTH DAILY, Disp: 90 tablet, Rfl: 2   Multiple Vitamins-Minerals  (MULTIVITAMIN PO), Take 1 tablet by mouth daily. , Disp: , Rfl:    montelukast (SINGULAIR) 10 MG tablet, Take 1 tablet (10 mg total) by mouth daily. As needed, Disp: 90 tablet, Rfl: 2   No Known Allergies   Review of Systems  Constitutional: Negative.   Eyes:  Negative for blurred vision.  Respiratory: Negative.  Negative for shortness of breath.   Cardiovascular: Negative.  Negative for chest pain and palpitations.  Gastrointestinal: Negative.   Musculoskeletal:  Positive for arthralgias.       She reports a wrist injury. She states a couple weeks ago she was painting her ceiling. She twisted her left wrist when coming down off of the ladder. She did not fall.  She states she now has a knot on her wrist.  She does experience pain with movement, which sometimes wakes her up in the middle of the night. She has taken Motrin which has helped somewhat with the pain.    Neurological: Negative.   Psychiatric/Behavioral: Negative.       Today's Vitals   08/05/23 0912 08/05/23 0942  BP: 134/82 (!) 150/90  Temp: 97.8 F (36.6 C)   SpO2: 98%   Weight: 220 lb 9.6 oz (100.1 kg)   Height: 5\' 4"  (1.626 m)    Body mass index is 37.87 kg/m.  Wt Readings from Last 3 Encounters:  08/05/23 220 lb 9.6 oz (100.1 kg)  01/27/23 218 lb 6.4 oz (99.1 kg)  08/25/22 232 lb 12.8 oz (105.6 kg)     Objective:  Physical Exam Vitals and nursing note reviewed.  Constitutional:      Appearance: Normal appearance. She is obese.  HENT:     Head: Normocephalic and atraumatic.  Eyes:     Extraocular Movements: Extraocular movements intact.  Cardiovascular:     Rate and Rhythm: Normal rate and regular rhythm.     Heart sounds: Normal heart sounds.  Pulmonary:     Effort: Pulmonary effort is normal.     Breath sounds: Normal breath sounds.  Musculoskeletal:        General: Tenderness present.     Cervical back: Normal range of motion.     Comments: Soft tissue mass left inner wrist, no overlying  erythema Slightly tender to touch  Skin:    General: Skin is warm.  Neurological:     General: No focal deficit present.     Mental Status: She is alert.  Psychiatric:        Mood and Affect: Mood normal.        Behavior: Behavior normal.         Assessment And Plan:  Hypertensive nephropathy Assessment & Plan: Chronic, uncontrolled. Goal BP<120/80 . EKG performed, NSR w/o acute changes.  She will continue with lisinopril/hydrochlorothiazide 20/12.5mg  daily for now. She agrees to rto in 2 weeks for NV. If BP still elevated, will consider addition of amlodipine 2.5mg  daily. She is encouraged to follow low sodium diet. Also advised to stop frequent use of Advil/ibuprofen/NSAIDs.   Orders: -     CMP14+EGFR -     Lipid panel  Stage 3a chronic kidney disease (HCC) Assessment & Plan: Chronic, she is encouraged to stay well hydrated, avoid NSAIDs and keep BP controlled to prevent progression of CKD.  We also reviewed the CKD heat map in full detail.   Orders: -     CMP14+EGFR -     PTH, intact and calcium -     Phosphorus -     Protein electrophoresis, serum  Hypercholesterolemia Assessment & Plan: Chronic, currently on atorvastatin 80mg  M-Saturdays. Encouraged to follow heart healthy lifestyle aiming for at least 150 minutes of exercise/week, avoiding fried foods and aiming for at least 7-8 hours of sleep nightly.   Orders: -     Lipoprotein A (LPA) -     Lipid panel  Estrogen deficiency Assessment & Plan: She is scheduled for bone density in May 2025, she is encouraged to keep this appt.    Other abnormal glucose Assessment & Plan: Previous labs reviewed, her A1c has been elevated in the past. I will check an A1c today. Reminded to avoid refined sugars including sugary drinks/foods and processed meats including bacon, sausages and deli meats.    Orders: -     Hemoglobin A1c  Class 2 severe obesity due to excess calories with serious comorbidity and body mass index  (BMI) of 37.0 to 37.9 in adult [facility]) Assessment & Plan: She is encouraged to strive for BMI less than 30 to decrease cardiac risk. Advised to aim for at least 150 minutes of exercise per week.    Other orders -     Montelukast Sodium; Take 1 tablet (10 mg total) by mouth daily. As needed  Dispense: 90 tablet; Refill: 2  She is encouraged to strive for BMI less than 30 to decrease cardiac risk. Advised to aim for at least 150 minutes of  exercise per week.    Return in about 3 weeks (around 08/26/2023), or bp check - NV.  Patient was given opportunity to ask questions. Patient verbalized understanding of the plan and was able to repeat key elements of the plan. All questions were answered to their satisfaction.    I, Gwynneth Aliment, MD, have reviewed all documentation for this visit. The documentation on 08/05/23 for the exam, diagnosis, procedures, and orders are all accurate and complete.   IF YOU HAVE BEEN REFERRED TO A SPECIALIST, IT MAY TAKE 1-2 WEEKS TO SCHEDULE/PROCESS THE REFERRAL. IF YOU HAVE NOT HEARD FROM US/SPECIALIST IN TWO WEEKS, PLEASE GIVE [place] A CALL AT [phone].   THE PATIENT IS ENCOURAGED TO PRACTICE SOCIAL DISTANCING DUE TO THE COVID-19 PANDEMIC.

## 2023-08-05 NOTE — Patient Instructions (Addendum)
 Wrist sleeve Voltaren gel Tylenol  Hypertension, Adult Hypertension is another name for high blood pressure. High blood pressure forces your heart to work harder to pump blood. This can cause problems over time. There are two numbers in a blood pressure reading. There is a top number (systolic) over a bottom number (diastolic). It is best to have a blood pressure that is below 120/80. What are the causes? The cause of this condition is not known. Some other conditions can lead to high blood pressure. What increases the risk? Some lifestyle factors can make you more likely to develop high blood pressure: Smoking. Not getting enough exercise or physical activity. Being overweight. Having too much fat, sugar, calories, or salt (sodium) in your diet. Drinking too much alcohol. Other risk factors include: Having any of these conditions: Heart disease. Diabetes. High cholesterol. Kidney disease. Obstructive sleep apnea. Having a family history of high blood pressure and high cholesterol. Age. The risk increases with age. Stress. What are the signs or symptoms? High blood pressure may not cause symptoms. Very high blood pressure (hypertensive crisis) may cause: Headache. Fast or uneven heartbeats (palpitations). Shortness of breath. Nosebleed. Vomiting or feeling like you may vomit (nauseous). Changes in how you see. Very bad chest pain. Feeling dizzy. Seizures. How is this treated? This condition is treated by making healthy lifestyle changes, such as: Eating healthy foods. Exercising more. Drinking less alcohol. Your doctor may prescribe medicine if lifestyle changes do not help enough and if: Your top number is above 130. Your bottom number is above 80. Your personal target blood pressure may vary. Follow these instructions at home: Eating and drinking  If told, follow the DASH eating plan. To follow this plan: Fill one half of your plate at each meal with fruits and  vegetables. Fill one fourth of your plate at each meal with whole grains. Whole grains include whole-wheat pasta, brown rice, and whole-grain bread. Eat or drink low-fat dairy products, such as skim milk or low-fat yogurt. Fill one fourth of your plate at each meal with low-fat (lean) proteins. Low-fat proteins include fish, chicken without skin, eggs, beans, and tofu. Avoid fatty meat, cured and processed meat, or chicken with skin. Avoid pre-made or processed food. Limit the amount of salt in your diet to less than 1,500 mg each day. Do not drink alcohol if: Your doctor tells you not to drink. You are pregnant, may be pregnant, or are planning to become pregnant. If you drink alcohol: Limit how much you have to: 0-1 drink a day for women. 0-2 drinks a day for men. Know how much alcohol is in your drink. In the [place] one drink equals one 12 oz bottle of beer (355 mL), one 5 oz glass of wine (148 mL), or one 1 oz glass of hard liquor (44 mL). Lifestyle  Work with your doctor to stay at a healthy weight or to lose weight. Ask your doctor what the best weight is for you. Get at least 30 minutes of exercise that causes your heart to beat faster (aerobic exercise) most days of the week. This may include walking, swimming, or biking. Get at least 30 minutes of exercise that strengthens your muscles (resistance exercise) at least 3 days a week. This may include lifting weights or doing Pilates. Do not smoke or use any products that contain nicotine or tobacco. If you need help quitting, ask your doctor. Check your blood pressure at home as told by your doctor. Keep all follow-up visits.  Medicines Take over-the-counter and prescription medicines only as told by your doctor. Follow directions carefully. Do not skip doses of blood pressure medicine. The medicine does not work as well if you skip doses. Skipping doses also puts you at risk for problems. Ask your doctor about side effects or  reactions to medicines that you should watch for. Contact a doctor if: You think you are having a reaction to the medicine you are taking. You have headaches that keep coming back. You feel dizzy. You have swelling in your ankles. You have trouble with your vision. Get help right away if: You get a very bad headache. You start to feel mixed up (confused). You feel weak or numb. You feel faint. You have very bad pain in your: Chest. Belly (abdomen). You vomit more than once. You have trouble breathing. These symptoms may be an emergency. Get help right away. Call 911. Do not wait to see if the symptoms will go away. Do not drive yourself to the hospital. Summary Hypertension is another name for high blood pressure. High blood pressure forces your heart to work harder to pump blood. For most people, a normal blood pressure is less than 120/80. Making healthy choices can help lower blood pressure. If your blood pressure does not get lower with healthy choices, you may need to take medicine. This information is not intended to replace advice given to you by your health care provider. Make sure you discuss any questions you have with your health care provider. Document Revised: 02/07/2021 Document Reviewed: 02/07/2021 Elsevier Patient Education  2024 [employer].

## 2023-08-07 LAB — CMP14+EGFR
ALT: 13 IU/L (ref 0–32)
AST: 14 IU/L (ref 0–40)
Albumin: 4.1 g/dL (ref 3.8–4.8)
Alkaline Phosphatase: 74 IU/L (ref 44–121)
BUN/Creatinine Ratio: 13 (ref 12–28)
BUN: 13 mg/dL (ref 8–27)
Bilirubin Total: 0.6 mg/dL (ref 0.0–1.2)
CO2: 27 mmol/L (ref 20–29)
Calcium: 9.9 mg/dL (ref 8.7–10.3)
Chloride: 98 mmol/L (ref 96–106)
Creatinine, Ser: 0.98 mg/dL (ref 0.57–1.00)
Globulin, Total: 2.5 g/dL (ref 1.5–4.5)
Glucose: 94 mg/dL (ref 70–99)
Potassium: 4.7 mmol/L (ref 3.5–5.2)
Sodium: 138 mmol/L (ref 134–144)
Total Protein: 6.6 g/dL (ref 6.0–8.5)
eGFR: 60 mL/min/{1.73_m2} (ref >59)

## 2023-08-07 LAB — LIPID PANEL
Chol/HDL Ratio: 2.9 ratio (ref 0.0–4.4)
Cholesterol, Total: 240 mg/dL — ABNORMAL HIGH (ref 100–199)
HDL: 83 mg/dL (ref >39)
LDL Chol Calc (NIH): 141 mg/dL — ABNORMAL HIGH (ref 0–99)
Triglycerides: 94 mg/dL (ref 0–149)
VLDL Cholesterol Cal: 16 mg/dL (ref 5–40)

## 2023-08-07 LAB — HEMOGLOBIN A1C
Est. average glucose Bld gHb Est-mCnc: 123 mg/dL
Hgb A1c MFr Bld: 5.9 % — ABNORMAL HIGH (ref 4.8–5.6)

## 2023-08-07 LAB — PROTEIN ELECTROPHORESIS, SERUM
A/G Ratio: 1.1 (ref 0.7–1.7)
Albumin ELP: 3.5 g/dL (ref 2.9–4.4)
Alpha 1: 0.3 g/dL (ref 0.0–0.4)
Alpha 2: 0.8 g/dL (ref 0.4–1.0)
Beta: 1.1 g/dL (ref 0.7–1.3)
Gamma Globulin: 1 g/dL (ref 0.4–1.8)
Globulin, Total: 3.1 g/dL (ref 2.2–3.9)

## 2023-08-07 LAB — LIPOPROTEIN A (LPA): Lipoprotein (a): 203.6 nmol/L — ABNORMAL HIGH (ref <75.0)

## 2023-08-07 LAB — PTH, INTACT AND CALCIUM: PTH: 49 pg/mL (ref 15–65)

## 2023-08-07 LAB — PHOSPHORUS: Phosphorus: 3.4 mg/dL (ref 3.0–4.3)

## 2023-08-08 ENCOUNTER — Encounter: Payer: Self-pay | Admitting: Internal Medicine

## 2023-08-08 NOTE — Assessment & Plan Note (Signed)
 Previous labs reviewed, her A1c has been elevated in the past. I will check an A1c today. Reminded to avoid refined sugars including sugary drinks/foods and processed meats including bacon, sausages and deli meats.

## 2023-08-08 NOTE — Assessment & Plan Note (Signed)
Chronic, currently on atorvastatin 80mg  M-Saturdays. Encouraged to follow heart healthy lifestyle aiming for at least 150 minutes of exercise/week, avoiding fried foods and aiming for at least 7-8 hours of sleep nightly.

## 2023-08-08 NOTE — Assessment & Plan Note (Signed)
 She is encouraged to strive for BMI less than 30 to decrease cardiac risk. Advised to aim for at least 150 minutes of exercise per week.

## 2023-08-08 NOTE — Assessment & Plan Note (Addendum)
 Chronic, uncontrolled. Goal BP<120/80 . EKG performed, NSR w/o acute changes.  She will continue with lisinopril/hydrochlorothiazide 20/12.5mg  daily for now. She agrees to rto in 2 weeks for NV. If BP still elevated, will consider addition of amlodipine 2.5mg  daily. She is encouraged to follow low sodium diet. Also advised to stop frequent use of Advil/ibuprofen/NSAIDs.

## 2023-08-08 NOTE — Assessment & Plan Note (Signed)
 Chronic, she is encouraged to stay well hydrated, avoid NSAIDs and keep BP controlled to prevent progression of CKD.  We also reviewed the CKD heat map in full detail.

## 2023-08-08 NOTE — Assessment & Plan Note (Signed)
 She is scheduled for bone density in May 2025, she is encouraged to keep this appt.

## 2023-08-25 ENCOUNTER — Ambulatory Visit

## 2023-08-25 VITALS — BP 142/82 | HR 61 | Temp 98.5°F | Ht 64.0 in | Wt 220.0 lb

## 2023-08-25 DIAGNOSIS — I129 Hypertensive chronic kidney disease with stage 1 through stage 4 chronic kidney disease, or unspecified chronic kidney disease: Secondary | ICD-10-CM

## 2023-08-25 MED ORDER — AMLODIPINE BESYLATE 2.5 MG PO TABS
2.5000 mg | ORAL_TABLET | Freq: Every day | ORAL | 1 refills | Status: DC
Start: 2023-08-25 — End: 2024-02-08

## 2023-08-25 NOTE — Progress Notes (Signed)
 Patient presents today for a BP check, patient currently taking  lisinopril -hydrochlorothiazide  20-12.5mg  AM.  BP Readings from Last 3 Encounters:  08/25/23 (!) 140/84  08/05/23 (!) 150/90  01/27/23 120/82   Per provider- start her on amlodipine  2.5mg  taken at night, NV in two weeks  Patient agreed to start medication. 2 week NV scheduled.

## 2023-09-08 ENCOUNTER — Ambulatory Visit

## 2023-09-08 VITALS — BP 138/78 | HR 63 | Temp 98.5°F | Ht 64.0 in | Wt 220.0 lb

## 2023-09-08 DIAGNOSIS — I129 Hypertensive chronic kidney disease with stage 1 through stage 4 chronic kidney disease, or unspecified chronic kidney disease: Secondary | ICD-10-CM

## 2023-09-08 MED ORDER — AMLODIPINE BESYLATE 5 MG PO TABS
5.0000 mg | ORAL_TABLET | Freq: Every day | ORAL | 1 refills | Status: DC
Start: 2023-09-08 — End: 2023-11-24

## 2023-09-08 NOTE — Progress Notes (Signed)
 Patient presents today for a BP check, patient currently taking amLODipine  2.5mg  PM , lisinopril -hydrochlorothiazide  20-12.5MG  AM. BP Readings from Last 3 Encounters:  09/08/23 (!) 140/80  08/25/23 (!) 142/82  08/05/23 (!) 150/90   Per provider- increase amLODipine  5mg  follow up 2 weeks NV.

## 2023-09-09 ENCOUNTER — Ambulatory Visit
Admission: RE | Admit: 2023-09-09 | Discharge: 2023-09-09 | Disposition: A | Discharge status: Home / Self Care | Payer: Medicare PPO | Source: Ambulatory Visit | Attending: Internal Medicine | Admitting: Internal Medicine

## 2023-09-09 DIAGNOSIS — N958 Other specified menopausal and perimenopausal disorders: Secondary | ICD-10-CM | POA: Diagnosis not present

## 2023-09-09 DIAGNOSIS — E2839 Other primary ovarian failure: Secondary | ICD-10-CM

## 2023-09-09 DIAGNOSIS — M8588 Other specified disorders of bone density and structure, other site: Secondary | ICD-10-CM | POA: Diagnosis not present

## 2023-09-11 ENCOUNTER — Encounter: Payer: Self-pay | Admitting: Internal Medicine

## 2023-09-22 ENCOUNTER — Ambulatory Visit: Payer: Self-pay

## 2023-09-22 VITALS — BP 136/76 | HR 56 | Temp 98.5°F | Ht 64.0 in | Wt 220.0 lb

## 2023-09-22 DIAGNOSIS — I129 Hypertensive chronic kidney disease with stage 1 through stage 4 chronic kidney disease, or unspecified chronic kidney disease: Secondary | ICD-10-CM

## 2023-09-22 MED ORDER — HYDROCHLOROTHIAZIDE 12.5 MG PO CAPS: 12.5000 mg | ORAL_CAPSULE | Freq: Every day | ORAL | 0 refills | Status: DC

## 2023-09-22 NOTE — Progress Notes (Signed)
 Patient presents today for a bp check. Patient reports compliance with her meds. Patient reports she is taking lisinopril  hydrochlorothiazide  20-12.5mg  in the mornings and amlodipine  5mg  in the evenings. I checked her bp and it was 140/80 P64. I had patient wait 10 minutes and it was 136/76 P56. After speaking with patient and provider patient agrees to adding a diuretic hydrochlorothiazide  12.5mg  instead of changing her lisinopril  hydrochlorothiazide  20-12.5mg   to Valsartan 80mg -12.5mg . patient's hydrochlorothiazide  has been sent to the pharmacy. Patient is to come back in 2 weeks for a bpc and bmp last visit. Order has been placed in future. YL,RMA    BP Readings from Last 3 Encounters:  09/08/23 138/78  08/25/23 (!) 142/82  08/05/23 (!) 150/90

## 2023-09-22 NOTE — Patient Instructions (Signed)
 Hypertension, Adult Hypertension is another name for high blood pressure. High blood pressure forces your heart to work harder to pump blood. This can cause problems over time. There are two numbers in a blood pressure reading. There is a top number (systolic) over a bottom number (diastolic). It is best to have a blood pressure that is below 120/80. What are the causes? The cause of this condition is not known. Some other conditions can lead to high blood pressure. What increases the risk? Some lifestyle factors can make you more likely to develop high blood pressure: Smoking. Not getting enough exercise or physical activity. Being overweight. Having too much fat, sugar, calories, or salt (sodium) in your diet. Drinking too much alcohol. Other risk factors include: Having any of these conditions: Heart disease. Diabetes. High cholesterol. Kidney disease. Obstructive sleep apnea. Having a family history of high blood pressure and high cholesterol. Age. The risk increases with age. Stress. What are the signs or symptoms? High blood pressure may not cause symptoms. Very high blood pressure (hypertensive crisis) may cause: Headache. Fast or uneven heartbeats (palpitations). Shortness of breath. Nosebleed. Vomiting or feeling like you may vomit (nauseous). Changes in how you see. Very bad chest pain. Feeling dizzy. Seizures. How is this treated? This condition is treated by making healthy lifestyle changes, such as: Eating healthy foods. Exercising more. Drinking less alcohol. Your doctor may prescribe medicine if lifestyle changes do not help enough and if: Your top number is above 130. Your bottom number is above 80. Your personal target blood pressure may vary. Follow these instructions at home: Eating and drinking  If told, follow the DASH eating plan. To follow this plan: Fill one half of your plate at each meal with fruits and vegetables. Fill one fourth of your plate  at each meal with whole grains. Whole grains include whole-wheat pasta, brown rice, and whole-grain bread. Eat or drink low-fat dairy products, such as skim milk or low-fat yogurt. Fill one fourth of your plate at each meal with low-fat (lean) proteins. Low-fat proteins include fish, chicken without skin, eggs, beans, and tofu. Avoid fatty meat, cured and processed meat, or chicken with skin. Avoid pre-made or processed food. Limit the amount of salt in your diet to less than 1,500 mg each day. Do not drink alcohol if: Your doctor tells you not to drink. You are pregnant, may be pregnant, or are planning to become pregnant. If you drink alcohol: Limit how much you have to: 0-1 drink a day for women. 0-2 drinks a day for men. Know how much alcohol is in your drink. In the [place] one drink equals one 12 oz bottle of beer (355 mL), one 5 oz glass of wine (148 mL), or one 1 oz glass of hard liquor (44 mL). Lifestyle  Work with your doctor to stay at a healthy weight or to lose weight. Ask your doctor what the best weight is for you. Get at least 30 minutes of exercise that causes your heart to beat faster (aerobic exercise) most days of the week. This may include walking, swimming, or biking. Get at least 30 minutes of exercise that strengthens your muscles (resistance exercise) at least 3 days a week. This may include lifting weights or doing Pilates. Do not smoke or use any products that contain nicotine or tobacco. If you need help quitting, ask your doctor. Check your blood pressure at home as told by your doctor. Keep all follow-up visits. Medicines Take over-the-counter and prescription medicines  only as told by your doctor. Follow directions carefully. Do not skip doses of blood pressure medicine. The medicine does not work as well if you skip doses. Skipping doses also puts you at risk for problems. Ask your doctor about side effects or reactions to medicines that you should watch  for. Contact a doctor if: You think you are having a reaction to the medicine you are taking. You have headaches that keep coming back. You feel dizzy. You have swelling in your ankles. You have trouble with your vision. Get help right away if: You get a very bad headache. You start to feel mixed up (confused). You feel weak or numb. You feel faint. You have very bad pain in your: Chest. Belly (abdomen). You vomit more than once. You have trouble breathing. These symptoms may be an emergency. Get help right away. Call 911. Do not wait to see if the symptoms will go away. Do not drive yourself to the hospital. Summary Hypertension is another name for high blood pressure. High blood pressure forces your heart to work harder to pump blood. For most people, a normal blood pressure is less than 120/80. Making healthy choices can help lower blood pressure. If your blood pressure does not get lower with healthy choices, you may need to take medicine. This information is not intended to replace advice given to you by your health care provider. Make sure you discuss any questions you have with your health care provider. Document Revised: 02/07/2021 Document Reviewed: 02/07/2021 Elsevier Patient Education  2024 [employer].

## 2023-11-05 ENCOUNTER — Other Ambulatory Visit: Payer: Self-pay | Admitting: Internal Medicine

## 2023-11-11 ENCOUNTER — Ambulatory Visit

## 2023-11-11 DIAGNOSIS — Z0000 Encounter for general adult medical examination without abnormal findings: Secondary | ICD-10-CM | POA: Diagnosis not present

## 2023-11-11 NOTE — Patient Instructions (Signed)
 Ms. Perl , Thank you for taking time out of your busy schedule to complete your Annual Wellness Visit with me. I enjoyed our conversation and look forward to speaking with you again next year. I, as well as your care team,  appreciate your ongoing commitment to your health goals. Please review the following plan we discussed and let me know if I can assist you in the future. Your Game plan/ To Do List    Referrals: If you haven't heard from the office you've been referred to, please reach out to them at the phone provided.  N/a Follow up Visits: Next Medicare AWV with our clinical staff: office will schedule   Have you seen your provider in the last 6 months (3 months if uncontrolled diabetes)? Yes Next Office Visit with your provider: 02/08/2024 at 8:40  Clinician Recommendations:  Aim for 30 minutes of exercise or brisk walking, 6-8 glasses of water, and 5 servings of fruits and vegetables each day.       This is a list of the screening recommended for you and due dates:  Health Maintenance  Topic Date Due   COVID-19 Vaccine (8 - 2024-25 season) 03/17/2023   Flu Shot  12/04/2023   DTaP/Tdap/Td vaccine (2 - Td or Tdap) 01/11/2024   Medicare Annual Wellness Visit  11/10/2024   Colon Cancer Screening  11/14/2025   Pneumococcal Vaccine for age over 34  Completed   DEXA scan (bone density measurement)  Completed   Hepatitis C Screening  Completed   Zoster (Shingles) Vaccine  Completed   Hepatitis B Vaccine  Aged Out   HPV Vaccine  Aged Out   Meningitis B Vaccine  Aged Out    Advanced directives: (Copy Requested) Please bring a copy of your health care power of attorney and living will to the office to be added to your chart at your convenience. You can mail to [facility] [address] [place], [place] [address] or email to [email or web address] Advance Care Planning is important because it:  [x]  Makes sure you receive the medical care that is consistent with  your values, goals, and preferences  [x]  It provides guidance to your family and loved ones and reduces their decisional burden about whether or not they are making the right decisions based on your wishes.  Follow the link provided in your after visit summary or read over the paperwork we have mailed to you to help you started getting your Advance Directives in place. If you need assistance in completing these, please reach out to us  so that we can help you!  See attachments for Preventive Care and Fall Prevention Tips.

## 2023-11-11 NOTE — Progress Notes (Signed)
 Subjective:   Gabrielle [name] is a 78 y.o. who presents for a Medicare Wellness preventive visit.  As a reminder, Annual Wellness Visits don't include a physical exam, and some assessments may be limited, especially if this visit is performed virtually. We may recommend an in-person follow-up visit with your provider if needed.  Visit Complete: Virtual I connected with  Gabrielle [name] on 11/11/23 by a audio enabled telemedicine application and verified that I am speaking with the correct person using two identifiers.  Patient Location: Home  Provider Location: Office/Clinic  I discussed the limitations of evaluation and management by telemedicine. The patient expressed understanding and agreed to proceed.  Vital Signs: Because this visit was a virtual/telehealth visit, some criteria may be missing or patient reported. Any vitals not documented were not able to be obtained and vitals that have been documented are patient reported.  VideoError- Librarian, academic were attempted between this provider and patient, however failed, due to patient having technical difficulties OR patient did not have access to video capability.  We continued and completed visit with audio only.   Persons Participating in Visit: Patient.  AWV Questionnaire: Yes: Patient Medicare AWV questionnaire was completed by the patient on 11/07/2023; I have confirmed that all information answered by patient is correct and no changes since this date.  Cardiac Risk Factors include: advanced age (>50men, >73 women);hypertension     Objective:    Today's Vitals   There is no height or weight on file to calculate BMI.     11/11/2023    2:08 PM 06/05/2022   10:47 AM 05/23/2021   11:44 AM 05/16/2020   11:13 AM 05/11/2019   12:13 PM 11/09/2018    3:37 PM 03/03/2018    9:51 AM  Advanced Directives  Does Patient Have a Medical Advance Directive? Yes Yes Yes Yes Yes Yes Yes   Type of Special educational needs teacher of [place];Living will Healthcare Power of [place];Living will Healthcare Power of [employer] of [place];Living will Healthcare Power of [place];Living will Living will Living will  Does patient want to make changes to medical advance directive?       No - Patient declined   Copy of Healthcare Power of Attorney in Chart? No - copy requested No - copy requested No - copy requested No - copy requested No - copy requested       Data saved with a previous flowsheet row definition    Current Medications (verified) Outpatient Encounter Medications as of 11/11/2023  Medication Sig   amLODipine  (NORVASC ) 5 MG tablet Take 1 tablet (5 mg total) by mouth daily.   aspirin 81 MG tablet Take 81 mg by mouth daily.   atorvastatin  (LIPITOR) 80 MG tablet TAKE 1 TABLET BY MOUTH ONCE DAILY, ON MONDAY THROUGH SATURDAY   b complex vitamins capsule Take 1 capsule by mouth daily.   Cholecalciferol (DIALYVITE VITAMIN D 5000 PO) Take by mouth daily.   lisinopril -hydrochlorothiazide  (ZESTORETIC ) 20-12.5 MG tablet TAKE 1 TABLET BY MOUTH DAILY   montelukast  (SINGULAIR ) 10 MG tablet Take 1 tablet (10 mg total) by mouth daily. As needed   Multiple Vitamins-Minerals (MULTIVITAMIN PO) Take 1 tablet by mouth daily.    amLODipine  (NORVASC ) 2.5 MG tablet Take 1 tablet (2.5 mg total) by mouth daily.   fluticasone  (FLONASE ) 50 MCG/ACT nasal spray Place 2 sprays into both nostrils daily. (Patient not taking: Reported on 11/11/2023)   hydrochlorothiazide  (MICROZIDE ) 12.5 MG capsule TAKE 1 CAPSULE(12.5 MG) BY  MOUTH DAILY   No facility-administered encounter medications on file as of 11/11/2023.    Allergies (verified) Patient has no known allergies.   History: Past Medical History:  Diagnosis Date   Cataract 2022   2 surgeries   Hemorrhoids    Hypertension    Obesity    Past Surgical History:  Procedure Laterality Date   BREAST EXCISIONAL BIOPSY Left 1991   BREAST SURGERY     CATARACT  EXTRACTION Bilateral    01/18/2021 and 02/22/2021   DILATATION AND CURETTAGE/HYSTEROSCOPY WITH MINERVA     EYE SURGERY  01/18/21 & 02/22/21   HYSTEROSCOPY WITH D & C  02/07/2022   LAPAROSCOPIC GASTRIC BANDING  2009   TUBAL LIGATION     Family History  Problem Relation Age of Onset   COPD Mother        colon   Cancer Mother    Heart disease Father    Heart disease Brother    Hypertension Maternal Grandmother    Social History   Socioeconomic History   Marital status: Single    Spouse name: Not on file   Number of children: Not on file   Years of education: Not on file   Highest education level: Some college, no degree  Occupational History   Occupation: retired  Tobacco Use   Smoking status: Never   Smokeless tobacco: Never  Vaping Use   Vaping status: Never Used  Substance and Sexual Activity   Alcohol use: Never   Drug use: Never   Sexual activity: Not Currently  Other Topics Concern   Not on file  Social History Narrative   Not on file   Social Drivers of Health   Financial Resource Strain: Low Risk  (11/07/2023)   Overall Financial Resource Strain (CARDIA)    Difficulty of Paying Living Expenses: Not very hard  Food Insecurity: No Food Insecurity (11/07/2023)   Hunger Vital Sign    Worried About Running Out of Food in the Last Year: Never true    Ran Out of Food in the Last Year: Never true  Transportation Needs: No Transportation Needs (11/07/2023)   PRAPARE - Administrator, Civil Service (Medical): No    Lack of Transportation (Non-Medical): No  Physical Activity: Sufficiently Active (11/07/2023)   Exercise Vital Sign    Days of Exercise per Week: 4 days    Minutes of Exercise per Session: 40 min  Stress: No Stress Concern Present (11/07/2023)   [employer] of Occupational Health - Occupational Stress Questionnaire    Feeling of Stress: Not at all  Social Connections: Moderately Integrated (11/07/2023)   Social Connection and Isolation Panel     Frequency of Communication with Friends and Family: More than three times a week    Frequency of Social Gatherings with Friends and Family: Twice a week    Attends Religious Services: More than 4 times per year    Active Member of [employer] or Organizations: Yes    Attends Engineer, structural: More than 4 times per year    Marital Status: Divorced    Tobacco Counseling Counseling given: Not Answered    Clinical Intake:  Pre-visit preparation completed: Yes  Pain : No/denies pain     Nutritional Risks: None Diabetes: No  Lab Results  Component Value Date   HGBA1C 5.9 (H) 08/05/2023   HGBA1C 6.1 (H) 01/27/2023   HGBA1C 6.2 (H) 05/28/2022     How often do you need to have someone help  you when you read instructions, pamphlets, or other written materials from your doctor or pharmacy?: 1 - Never  Interpreter Needed?: No  Information entered by :: NAllen LPN   Activities of Daily Living     11/11/2023    2:02 PM  In your present state of health, do you have any difficulty performing the following activities:  Hearing? 0  Vision? 0  Difficulty concentrating or making decisions? 0  Walking or climbing stairs? 0  Dressing or bathing? 0  Doing errands, shopping? 0  Preparing Food and eating ? N  Using the Toilet? N  In the past six months, have you accidently leaked urine? N  Do you have problems with loss of bowel control? N  Managing your Medications? N  Managing your Finances? N  Housekeeping or managing your Housekeeping? N    Patient Care Team: Jarold Medici, MD as PCP - General (Internal Medicine) Octavia Bruckner, MD as Consulting Physician (Ophthalmology)  I have updated your Care Teams any recent Medical Services you may have received from other providers in the past year.     Assessment:   This is a routine wellness examination for [place].  Hearing/Vision screen Hearing Screening - Comments:: Denies hearing issues Vision Screening -  Comments:: Regular eye exams, [facility]   Goals Addressed             This Visit's Progress    Patient Stated       11/11/2023, track BP and change diet to keep BP under control       Depression Screen     11/11/2023    2:10 PM 08/05/2023    9:11 AM 01/27/2023    3:15 PM 08/25/2022    8:32 AM 06/05/2022   10:47 AM 05/28/2022   10:39 AM 05/23/2021   11:45 AM  PHQ 2/9 Scores  PHQ - 2 Score 0 0 0 0 0 0 0  PHQ- 9 Score 1 0 0 0       Fall Risk     11/11/2023    2:09 PM 08/05/2023    9:11 AM 08/25/2022    8:32 AM 06/05/2022   10:47 AM 05/28/2022   10:39 AM  Fall Risk   Falls in the past year? 0 0 0 0 0  Number falls in past yr: 0 0 0 0 0  Injury with Fall? 0 0 0 0 0  Risk for fall due to : Medication side effect No Fall Risks No Fall Risks Medication side effect No Fall Risks  Follow up Falls evaluation completed;Falls prevention discussed Falls evaluation completed Falls evaluation completed Falls prevention discussed;Education provided;Falls evaluation completed Falls evaluation completed      Data saved with a previous flowsheet row definition    MEDICARE RISK AT HOME:  Medicare Risk at Home Any stairs in or around the home?: Yes If so, are there any without handrails?: No Home free of loose throw rugs in walkways, pet beds, electrical cords, etc?: Yes Adequate lighting in your home to reduce risk of falls?: Yes Life alert?: No Use of a cane, walker or w/c?: No Grab bars in the bathroom?: No Shower chair or bench in shower?: No Elevated toilet seat or a handicapped toilet?: No  TIMED UP AND GO:  Was the test performed?  No  Cognitive Function: 6CIT completed        11/11/2023    2:11 PM 06/05/2022   10:48 AM 05/23/2021   11:46 AM 05/16/2020   11:16 AM  05/11/2019   12:15 PM  6CIT Screen  What Year? 0 points 0 points 0 points 0 points 0 points  What month? 0 points 0 points 0 points 0 points 0 points  What time? 0 points 0 points 0 points 0 points 0 points  Count back from  20 0 points 0 points 0 points 0 points 0 points  Months in reverse 0 points 0 points 0 points 0 points 0 points  Repeat phrase 0 points 2 points 2 points 0 points 0 points  Total Score 0 points 2 points 2 points 0 points 0 points    Immunizations Immunization History  Administered Date(s) Administered   Fluad Quad(high Dose 65+) 01/01/2019, 02/09/2021   Influenza, High Dose Seasonal PF 03/03/2018   Influenza,inj,Quad PF,6+ Mos 01/01/2019   Influenza-Unspecified 01/24/2020, 02/14/2022, 01/20/2023   Moderna Covid Bivalent Peds Booster(46mo Thru 9yrs) 02/14/2022   Moderna Covid-19 Vaccine Bivalent Booster 66yrs & up 02/09/2021   Moderna Sars-Covid-2 Vaccination 06/10/2019, 07/13/2019, 03/11/2020, 09/24/2020, 01/20/2023   Pneumococcal Conjugate-13 07/07/2018, 07/07/2018   Pneumococcal Polysaccharide-23 05/16/2020   Tdap 01/10/2014   Zoster Recombinant(Shingrix ) 10/23/2020, 10/02/2021    Screening Tests Health Maintenance  Topic Date Due   COVID-19 Vaccine (8 - 2024-25 season) 03/17/2023   INFLUENZA VACCINE  12/04/2023   DTaP/Tdap/Td (2 - Td or Tdap) 01/11/2024   Medicare Annual Wellness (AWV)  11/10/2024   Colonoscopy  11/14/2025   Pneumococcal Vaccine: 50+ Years  Completed   DEXA SCAN  Completed   Hepatitis C Screening  Completed   Zoster Vaccines- Shingrix   Completed   Hepatitis B Vaccines  Aged Out   HPV VACCINES  Aged Out   Meningococcal B Vaccine  Aged Out    Health Maintenance  Health Maintenance Due  Topic Date Due   COVID-19 Vaccine (8 - 2024-25 season) 03/17/2023   Health Maintenance Items Addressed: Up to date  Additional Screening:  Vision Screening: Recommended annual ophthalmology exams for early detection of glaucoma and other disorders of the eye. Would you like a referral to an eye doctor? No    Dental Screening: Recommended annual dental exams for proper oral hygiene  Community Resource Referral / Chronic Care Management: CRR required this visit?   No   CCM required this visit?  No   Plan:    I have personally reviewed and noted the following in the patient's chart:   Medical and social history Use of alcohol, tobacco or illicit drugs  Current medications and supplements including opioid prescriptions. Patient is not currently taking opioid prescriptions. Functional ability and status Nutritional status Physical activity Advanced directives List of other physicians Hospitalizations, surgeries, and ER visits in previous 12 months Vitals Screenings to include cognitive, depression, and falls Referrals and appointments  In addition, I have reviewed and discussed with patient certain preventive protocols, quality metrics, and best practice recommendations. A written personalized care plan for preventive services as well as general preventive health recommendations were provided to patient.   Ardella FORBES Dawn, LPN   [id number]/[id number]   After Visit Summary: (MyChart) Due to this being a telephonic visit, the after visit summary with patients personalized plan was offered to patient via MyChart   Notes: Nothing significant to report at this time.

## 2023-11-24 ENCOUNTER — Ambulatory Visit

## 2023-11-24 ENCOUNTER — Other Ambulatory Visit: Payer: Self-pay

## 2023-11-24 ENCOUNTER — Encounter: Payer: Self-pay | Admitting: Internal Medicine

## 2023-11-24 VITALS — BP 120/64 | HR 63 | Temp 98.2°F | Ht 64.0 in | Wt 220.0 lb

## 2023-11-24 DIAGNOSIS — I129 Hypertensive chronic kidney disease with stage 1 through stage 4 chronic kidney disease, or unspecified chronic kidney disease: Secondary | ICD-10-CM

## 2023-11-24 MED ORDER — AMLODIPINE BESYLATE 5 MG PO TABS: 5.0000 mg | ORAL_TABLET | Freq: Every day | ORAL | 1 refills | Status: DC

## 2023-11-24 NOTE — Patient Instructions (Signed)
 Hypertension, Adult Hypertension is another name for high blood pressure. High blood pressure forces your heart to work harder to pump blood. This can cause problems over time. There are two numbers in a blood pressure reading. There is a top number (systolic) over a bottom number (diastolic). It is best to have a blood pressure that is below 120/80. What are the causes? The cause of this condition is not known. Some other conditions can lead to high blood pressure. What increases the risk? Some lifestyle factors can make you more likely to develop high blood pressure: Smoking. Not getting enough exercise or physical activity. Being overweight. Having too much fat, sugar, calories, or salt (sodium) in your diet. Drinking too much alcohol. Other risk factors include: Having any of these conditions: Heart disease. Diabetes. High cholesterol. Kidney disease. Obstructive sleep apnea. Having a family history of high blood pressure and high cholesterol. Age. The risk increases with age. Stress. What are the signs or symptoms? High blood pressure may not cause symptoms. Very high blood pressure (hypertensive crisis) may cause: Headache. Fast or uneven heartbeats (palpitations). Shortness of breath. Nosebleed. Vomiting or feeling like you may vomit (nauseous). Changes in how you see. Very bad chest pain. Feeling dizzy. Seizures. How is this treated? This condition is treated by making healthy lifestyle changes, such as: Eating healthy foods. Exercising more. Drinking less alcohol. Your doctor may prescribe medicine if lifestyle changes do not help enough and if: Your top number is above 130. Your bottom number is above 80. Your personal target blood pressure may vary. Follow these instructions at home: Eating and drinking  If told, follow the DASH eating plan. To follow this plan: Fill one half of your plate at each meal with fruits and vegetables. Fill one fourth of your plate  at each meal with whole grains. Whole grains include whole-wheat pasta, brown rice, and whole-grain bread. Eat or drink low-fat dairy products, such as skim milk or low-fat yogurt. Fill one fourth of your plate at each meal with low-fat (lean) proteins. Low-fat proteins include fish, chicken without skin, eggs, beans, and tofu. Avoid fatty meat, cured and processed meat, or chicken with skin. Avoid pre-made or processed food. Limit the amount of salt in your diet to less than 1,500 mg each day. Do not drink alcohol if: Your doctor tells you not to drink. You are pregnant, may be pregnant, or are planning to become pregnant. If you drink alcohol: Limit how much you have to: 0-1 drink a day for women. 0-2 drinks a day for men. Know how much alcohol is in your drink. In the [place] one drink equals one 12 oz bottle of beer (355 mL), one 5 oz glass of wine (148 mL), or one 1 oz glass of hard liquor (44 mL). Lifestyle  Work with your doctor to stay at a healthy weight or to lose weight. Ask your doctor what the best weight is for you. Get at least 30 minutes of exercise that causes your heart to beat faster (aerobic exercise) most days of the week. This may include walking, swimming, or biking. Get at least 30 minutes of exercise that strengthens your muscles (resistance exercise) at least 3 days a week. This may include lifting weights or doing Pilates. Do not smoke or use any products that contain nicotine or tobacco. If you need help quitting, ask your doctor. Check your blood pressure at home as told by your doctor. Keep all follow-up visits. Medicines Take over-the-counter and prescription medicines  only as told by your doctor. Follow directions carefully. Do not skip doses of blood pressure medicine. The medicine does not work as well if you skip doses. Skipping doses also puts you at risk for problems. Ask your doctor about side effects or reactions to medicines that you should watch  for. Contact a doctor if: You think you are having a reaction to the medicine you are taking. You have headaches that keep coming back. You feel dizzy. You have swelling in your ankles. You have trouble with your vision. Get help right away if: You get a very bad headache. You start to feel mixed up (confused). You feel weak or numb. You feel faint. You have very bad pain in your: Chest. Belly (abdomen). You vomit more than once. You have trouble breathing. These symptoms may be an emergency. Get help right away. Call 911. Do not wait to see if the symptoms will go away. Do not drive yourself to the hospital. Summary Hypertension is another name for high blood pressure. High blood pressure forces your heart to work harder to pump blood. For most people, a normal blood pressure is less than 120/80. Making healthy choices can help lower blood pressure. If your blood pressure does not get lower with healthy choices, you may need to take medicine. This information is not intended to replace advice given to you by your health care provider. Make sure you discuss any questions you have with your health care provider. Document Revised: 02/07/2021 Document Reviewed: 02/07/2021 Elsevier Patient Education  2024 [employer].

## 2023-11-24 NOTE — Progress Notes (Signed)
 Patient presents today for a blood pressure check. Patient reports compliance with her meds. Patient reports she takes her lisinopril  hydrochlorothiazide  20-12.5mg  and amlodipine  5mg  in the evenings. I checked patient's blood pressure and it was 144/70 P66. Patient reports she is not sure if she should be taking an additional hydrochlorothiazide . I had the patient wait 10 minutes and rechecked her blood pressure it was 120/64 P63. Patient was advised to continue with her current medications and keep her next appointment as scheduled. YL,RMA    BP Readings from Last 3 Encounters:  09/22/23 136/76  09/08/23 138/78  08/25/23 (!) 142/82

## 2024-02-08 ENCOUNTER — Ambulatory Visit (INDEPENDENT_AMBULATORY_CARE_PROVIDER_SITE_OTHER): Payer: Medicare PPO | Admitting: Internal Medicine

## 2024-02-08 ENCOUNTER — Encounter: Payer: Medicare PPO | Admitting: Internal Medicine

## 2024-02-08 ENCOUNTER — Encounter: Payer: Self-pay | Admitting: Internal Medicine

## 2024-02-08 VITALS — BP 140/90 | HR 64 | Temp 98.2°F | Ht 64.0 in | Wt 226.0 lb

## 2024-02-08 DIAGNOSIS — Z0000 Encounter for general adult medical examination without abnormal findings: Secondary | ICD-10-CM | POA: Diagnosis not present

## 2024-02-08 DIAGNOSIS — R7309 Other abnormal glucose: Secondary | ICD-10-CM

## 2024-02-08 DIAGNOSIS — H6122 Impacted cerumen, left ear: Secondary | ICD-10-CM

## 2024-02-08 DIAGNOSIS — Z6838 Body mass index (BMI) 38.0-38.9, adult: Secondary | ICD-10-CM | POA: Diagnosis not present

## 2024-02-08 DIAGNOSIS — E66812 Obesity, class 2: Secondary | ICD-10-CM

## 2024-02-08 DIAGNOSIS — I129 Hypertensive chronic kidney disease with stage 1 through stage 4 chronic kidney disease, or unspecified chronic kidney disease: Secondary | ICD-10-CM | POA: Diagnosis not present

## 2024-02-08 DIAGNOSIS — N1831 Chronic kidney disease, stage 3a: Secondary | ICD-10-CM | POA: Diagnosis not present

## 2024-02-08 DIAGNOSIS — E7800 Pure hypercholesterolemia, unspecified: Secondary | ICD-10-CM | POA: Diagnosis not present

## 2024-02-08 LAB — POCT URINALYSIS DIP (CLINITEK)
Bilirubin, UA: NEGATIVE
Blood, UA: NEGATIVE
Glucose, UA: NEGATIVE mg/dL
Ketones, POC UA: NEGATIVE mg/dL
Nitrite, UA: NEGATIVE
POC PROTEIN,UA: NEGATIVE
Spec Grav, UA: 1.015 (ref 1.010–1.025)
Urobilinogen, UA: 1 U/dL
pH, UA: 6 (ref 5.0–8.0)

## 2024-02-08 NOTE — Assessment & Plan Note (Addendum)
 A full exam was performed.  Importance of monthly self breast exams was discussed with the patient.  She is advised to get 30-45 minutes of regular exercise, no less than four to five days per week. Both weight-bearing and aerobic exercises are recommended.  She is advised to follow a healthy diet with at least six fruits/veggies per day, decrease intake of red meat and other saturated fats and to increase fish intake to twice weekly.  Meats/fish should not be fried -- baked, boiled or broiled is preferable. It is also important to cut back on your sugar intake.  Be sure to read labels - try to avoid anything with added sugar, high fructose corn syrup or other sweeteners.  If you must use a sweetener, you can try stevia or monkfruit.  It is also important to avoid artificially sweetened foods/beverages and diet drinks. Lastly, wear SPF 50 sunscreen on exposed skin and when in direct sunlight for an extended period of time.  Be sure to avoid fast food restaurants and aim for at least 60 ounces of water daily.    - Increase exercise to five days a week, including cardio and strength training. - Reduce intake of processed meats, breads, and cheese. - Schedule nurse visit in a month for tetanus vaccine. - Schedule six-month follow-up and next year's physical.

## 2024-02-08 NOTE — Assessment & Plan Note (Signed)
 Prefers over-the-counter drops for management. - Use over-the-counter drops for cerumen impaction in the left ear.

## 2024-02-08 NOTE — Patient Instructions (Signed)

## 2024-02-08 NOTE — Assessment & Plan Note (Signed)
 She is encouraged to strive for BMI less than 30 to decrease cardiac risk. Advised to aim for at least 150 minutes of exercise per week.

## 2024-02-08 NOTE — Assessment & Plan Note (Addendum)
 Chronic, fair control.  EKG performed, NSR w/o acute changes. Hypertension with white coat syndrome.  Home blood pressure readings indicate effective medication regimen. No side effects reported. - Continue lisinopril  and hydrochlorothiazide . - Monitor blood pressure daily, report if consistently below 110/70-80. - Consider referral to hypertension clinic if further evaluation is desired.

## 2024-02-08 NOTE — Progress Notes (Signed)
 I,Gabrielle [name], CMA,acting as a Neurosurgeon for Gabrielle LOISE Slocumb, MD.,have documented all relevant documentation on the behalf of Gabrielle LOISE Slocumb, MD,as directed by  Gabrielle LOISE Slocumb, MD while in the presence of Gabrielle LOISE Slocumb, MD.  Subjective:    Patient ID: Gabrielle [name] , female    DOB: [birth year] , 78 y.o.   MRN: [id number]  Chief Complaint  Patient presents with   Annual Exam    The patient is here today for a physical examination.  The patient is no longer followed by GYN.  She has no specific concerns or complaints at this time.  She reports compliance with meds. She denies headaches, chest pain and shortness of breath. Patient had a few questions about her blood pressure they have been running normal without meds.     HPI Discussed the use of AI scribe software for clinical note transcription with the patient, who gave verbal consent to proceed.  History of Present Illness Gabrielle [name] is a 78 year old female with hypertension who presents for a physical exam and blood pressure management.  She experiences 'white coat syndrome', leading to elevated blood pressure readings in clinical settings. At home, her blood pressure typically ranges from the 110s to 120s over 70s, occasionally dropping below 110, and sometimes reaching the 130s. These readings are taken before her morning medication. She is currently taking lisinopril  and hydrochlorothiazide  for blood pressure management. She previously discontinued amlodipine  as it did not affect her blood pressure readings.  Her medication regimen also includes atorvastatin  80 mg Monday through Saturday, aspirin, Flonase  as needed, and Singulair  for allergies.  She engages in regular physical activity, exercising about four days a week, including yard work and using a stationary bike.  She drinks four to five bottles of water daily and has noticed an improvement in her kidney function over the past year. She has a history of stage 3A chronic  kidney disease, which has improved to stage 2.  No stress affecting sleep or daily life, although she acknowledges the general stress of the current world situation. No significant side effects from her medications and no new symptoms reported.   Hypertension This is a chronic problem. The current episode started more than 1 year ago. The problem has been gradually improving since onset. The problem is controlled. Pertinent negatives include no blurred vision. Risk factors for coronary artery disease include dyslipidemia, post-menopausal state and obesity. Past treatments include ACE inhibitors and diuretics. The current treatment provides moderate improvement.     Past Medical History:  Diagnosis Date   Cataract 2022   2 surgeries   Hemorrhoids    Hypertension    Obesity      Family History  Problem Relation Age of Onset   COPD Mother        colon   Cancer Mother    Heart disease Father    Heart disease Brother    Hypertension Maternal Grandmother      Current Outpatient Medications:    aspirin 81 MG tablet, Take 81 mg by mouth daily., Disp: , Rfl:    atorvastatin  (LIPITOR) 80 MG tablet, TAKE 1 TABLET BY MOUTH ONCE DAILY, ON MONDAY THROUGH SATURDAY, Disp: 78 tablet, Rfl: 2   b complex vitamins capsule, Take 1 capsule by mouth daily., Disp: , Rfl:    Cholecalciferol (DIALYVITE VITAMIN D 5000 PO), Take by mouth daily., Disp: , Rfl:    fluticasone  (FLONASE ) 50 MCG/ACT nasal spray, Place 2 sprays into both nostrils daily.,  Disp: 16 g, Rfl: 6   lisinopril -hydrochlorothiazide  (ZESTORETIC ) 20-12.5 MG tablet, TAKE 1 TABLET BY MOUTH DAILY, Disp: 90 tablet, Rfl: 2   montelukast  (SINGULAIR ) 10 MG tablet, Take 1 tablet (10 mg total) by mouth daily. As needed, Disp: 90 tablet, Rfl: 2   Multiple Vitamins-Minerals (MULTIVITAMIN PO), Take 1 tablet by mouth daily. , Disp: , Rfl:    No Known Allergies    The patient states she uses post menopausal status for birth control. No LMP recorded.  Patient is postmenopausal.. Negative for Dysmenorrhea. Negative for: breast discharge, breast lump(s), breast pain and breast self exam. Associated symptoms include abnormal vaginal bleeding. Pertinent negatives include abnormal bleeding (hematology), anxiety, decreased libido, depression, difficulty falling sleep, dyspareunia, history of infertility, nocturia, sexual dysfunction, sleep disturbances, urinary incontinence, urinary urgency, vaginal discharge and vaginal itching. Diet regular.The patient states her exercise level is  moderate.  . The patient's tobacco use is:  Social History   Tobacco Use  Smoking Status Never  Smokeless Tobacco Never  . She has been exposed to passive smoke. The patient's alcohol use is:  Social History   Substance and Sexual Activity  Alcohol Use Never    Review of Systems  Constitutional: Negative.   HENT: Negative.    Eyes: Negative.  Negative for blurred vision.  Respiratory: Negative.    Cardiovascular: Negative.   Gastrointestinal: Negative.   Endocrine: Negative.   Genitourinary: Negative.   Musculoskeletal: Negative.   Skin: Negative.   Allergic/Immunologic: Negative.   Neurological: Negative.   Hematological: Negative.   Psychiatric/Behavioral: Negative.       Today's Vitals   02/08/24 0841 02/08/24 0928  BP: (!) 140/80 (!) 140/90  Pulse: 64   Temp: 98.2 F (36.8 C)   TempSrc: Oral   Weight: 226 lb (102.5 kg)   Height: 5' 4 (1.626 m)   PainSc: 0-No pain    Body mass index is 38.79 kg/m.  Wt Readings from Last 3 Encounters:  02/08/24 226 lb (102.5 kg)  11/24/23 220 lb (99.8 kg)  09/22/23 220 lb (99.8 kg)     Objective:  Physical Exam Vitals and nursing note reviewed.  Constitutional:      Appearance: Normal appearance.  HENT:     Head: Normocephalic and atraumatic.     Right Ear: Tympanic membrane, ear canal and external ear normal.     Left Ear: Ear canal and external ear normal. There is impacted cerumen.     Nose:  Nose normal.     Mouth/Throat:     Mouth: Mucous membranes are moist.     Pharynx: Oropharynx is clear.  Eyes:     Extraocular Movements: Extraocular movements intact.     Conjunctiva/sclera: Conjunctivae normal.     Pupils: Pupils are equal, round, and reactive to light.  Cardiovascular:     Rate and Rhythm: Normal rate and regular rhythm.     Pulses: Normal pulses.     Heart sounds: Normal heart sounds.  Pulmonary:     Effort: Pulmonary effort is normal.     Breath sounds: Normal breath sounds.  Chest:  Breasts:    Tanner Score is 5.     Right: Normal.     Left: Normal.  Abdominal:     General: Bowel sounds are normal.     Palpations: Abdomen is soft.  Genitourinary:    Comments: deferred Musculoskeletal:        General: Normal range of motion.     Cervical back: Normal range of motion  and neck supple.  Skin:    General: Skin is warm and dry.  Neurological:     General: No focal deficit present.     Mental Status: She is alert and oriented to person, place, and time.  Psychiatric:        Mood and Affect: Mood normal.        Behavior: Behavior normal.         Assessment And Plan:     Encounter for general adult medical examination w/o abnormal findings Assessment & Plan: A full exam was performed.  Importance of monthly self breast exams was discussed with the patient.  She is advised to get 30-45 minutes of regular exercise, no less than four to five days per week. Both weight-bearing and aerobic exercises are recommended.  She is advised to follow a healthy diet with at least six fruits/veggies per day, decrease intake of red meat and other saturated fats and to increase fish intake to twice weekly.  Meats/fish should not be fried -- baked, boiled or broiled is preferable. It is also important to cut back on your sugar intake.  Be sure to read labels - try to avoid anything with added sugar, high fructose corn syrup or other sweeteners.  If you must use a sweetener, you  can try stevia or monkfruit.  It is also important to avoid artificially sweetened foods/beverages and diet drinks. Lastly, wear SPF 50 sunscreen on exposed skin and when in direct sunlight for an extended period of time.  Be sure to avoid fast food restaurants and aim for at least 60 ounces of water daily.    - Increase exercise to five days a week, including cardio and strength training. - Reduce intake of processed meats, breads, and cheese. - Schedule nurse visit in a month for tetanus vaccine. - Schedule six-month follow-up and next year's physical.    Hypertensive nephropathy Assessment & Plan: Chronic, fair control.  EKG performed, NSR w/o acute changes. Hypertension with white coat syndrome.  Home blood pressure readings indicate effective medication regimen. No side effects reported. - Continue lisinopril  and hydrochlorothiazide . - Monitor blood pressure daily, report if consistently below 110/70-80. - Consider referral to hypertension clinic if further evaluation is desired.  Orders: -     POCT URINALYSIS DIP (CLINITEK) -     Microalbumin / creatinine urine ratio -     EKG 12-Lead -     CBC -     CMP14+EGFR -     Lipid panel  Stage 3a chronic kidney disease (HCC) Assessment & Plan: Chronic, she is encouraged to stay well hydrated, avoid NSAIDs and keep BP controlled to prevent progression of CKD.  We also reviewed the CKD heat map in full detail.  - Most recent reading was in stage 2 range. She is aware that she will move to stage 2 if today's reading is in this range.  - She agrees to renal u/s if she is in stage 3a  Orders: -     CMP14+EGFR  Hypercholesterolemia Assessment & Plan: Chronic, currently on atorvastatin  80mg  M-Saturdays. Encouraged to follow heart healthy lifestyle aiming for at least 150 minutes of exercise/week, avoiding fried foods and aiming for at least 7-8 hours of sleep nightly   Orders: -     CMP14+EGFR -     Lipid panel -     TSH  Left ear  impacted cerumen Assessment & Plan: Prefers over-the-counter drops for management. - Use over-the-counter drops for cerumen impaction in  the left ear.   Other abnormal glucose Assessment & Plan: Previous labs reviewed, her A1c has been elevated in the past. I will check an A1c today. Reminded to avoid refined sugars including sugary drinks/foods and processed meats including bacon, sausages and deli meats.    Orders: -     CMP14+EGFR -     Hemoglobin A1c  Class 2 severe obesity due to excess calories with serious comorbidity and body mass index (BMI) of 38.0 to 38.9 in adult Assessment & Plan: She is encouraged to strive for BMI less than 30 to decrease cardiac risk. Advised to aim for at least 150 minutes of exercise per week.     Return in 4 weeks (on 03/07/2024), or [place] - Tdap, for 1 year physical, 6 month bp. Patient was given opportunity to ask questions. Patient verbalized understanding of the plan and was able to repeat key elements of the plan. All questions were answered to their satisfaction.   I, Gabrielle LOISE Slocumb, MD, have reviewed all documentation for this visit. The documentation on 02/08/24 for the exam, diagnosis, procedures, and orders are all accurate and complete.

## 2024-02-08 NOTE — Assessment & Plan Note (Addendum)
 Chronic, she is encouraged to stay well hydrated, avoid NSAIDs and keep BP controlled to prevent progression of CKD.  We also reviewed the CKD heat map in full detail.  - Most recent reading was in stage 2 range. She is aware that she will move to stage 2 if today's reading is in this range.  - She agrees to renal u/s if she is in stage 3a

## 2024-02-08 NOTE — Assessment & Plan Note (Signed)
 Previous labs reviewed, her A1c has been elevated in the past. I will check an A1c today. Reminded to avoid refined sugars including sugary drinks/foods and processed meats including bacon, sausages and deli meats.

## 2024-02-08 NOTE — Assessment & Plan Note (Signed)
Chronic, currently on atorvastatin 80mg  M-Saturdays. Encouraged to follow heart healthy lifestyle aiming for at least 150 minutes of exercise/week, avoiding fried foods and aiming for at least 7-8 hours of sleep nightly.

## 2024-02-09 ENCOUNTER — Ambulatory Visit: Payer: Self-pay | Admitting: Internal Medicine

## 2024-02-09 LAB — CMP14+EGFR
ALT: 12 IU/L (ref 0–32)
AST: 12 IU/L (ref 0–40)
Albumin: 4.2 g/dL (ref 3.8–4.8)
Alkaline Phosphatase: 86 IU/L (ref 49–135)
BUN/Creatinine Ratio: 14 (ref 12–28)
BUN: 16 mg/dL (ref 8–27)
Bilirubin Total: 0.5 mg/dL (ref 0.0–1.2)
CO2: 26 mmol/L (ref 20–29)
Calcium: 9.7 mg/dL (ref 8.7–10.3)
Chloride: 97 mmol/L (ref 96–106)
Creatinine, Ser: 1.12 mg/dL — ABNORMAL HIGH (ref 0.57–1.00)
Globulin, Total: 2.6 g/dL (ref 1.5–4.5)
Glucose: 101 mg/dL — ABNORMAL HIGH (ref 70–99)
Potassium: 4.4 mmol/L (ref 3.5–5.2)
Sodium: 140 mmol/L (ref 134–144)
Total Protein: 6.8 g/dL (ref 6.0–8.5)
eGFR: 51 mL/min/1.73 — ABNORMAL LOW (ref >59)

## 2024-02-09 LAB — CBC
Hematocrit: 41.2 % (ref 34.0–46.6)
Hemoglobin: 12.8 g/dL (ref 11.1–15.9)
MCH: 29.4 pg (ref 26.6–33.0)
MCHC: 31.1 g/dL — ABNORMAL LOW (ref 31.5–35.7)
MCV: 95 fL (ref 79–97)
Platelets: 327 x10E3/uL (ref 150–450)
RBC: 4.36 x10E6/uL (ref 3.77–5.28)
RDW: 13.1 % (ref 11.7–15.4)
WBC: 8.2 x10E3/uL (ref 3.4–10.8)

## 2024-02-09 LAB — LIPID PANEL
Chol/HDL Ratio: 3.1 ratio (ref 0.0–4.4)
Cholesterol, Total: 244 mg/dL — ABNORMAL HIGH (ref 100–199)
HDL: 80 mg/dL (ref >39)
LDL Chol Calc (NIH): 149 mg/dL — ABNORMAL HIGH (ref 0–99)
Triglycerides: 86 mg/dL (ref 0–149)
VLDL Cholesterol Cal: 15 mg/dL (ref 5–40)

## 2024-02-09 LAB — HEMOGLOBIN A1C
Est. average glucose Bld gHb Est-mCnc: 126 mg/dL
Hgb A1c MFr Bld: 6 % — ABNORMAL HIGH (ref 4.8–5.6)

## 2024-02-09 LAB — MICROALBUMIN / CREATININE URINE RATIO
Creatinine, Urine: 147.5 mg/dL
Microalb/Creat Ratio: 3 mg/g{creat} (ref 0–29)
Microalbumin, Urine: 4.4 ug/mL

## 2024-02-09 LAB — TSH: TSH: 0.863 u[IU]/mL (ref 0.450–4.500)

## 2024-03-08 ENCOUNTER — Ambulatory Visit: Payer: Self-pay

## 2024-03-09 ENCOUNTER — Other Ambulatory Visit: Payer: Self-pay | Admitting: Internal Medicine

## 2024-03-09 DIAGNOSIS — I129 Hypertensive chronic kidney disease with stage 1 through stage 4 chronic kidney disease, or unspecified chronic kidney disease: Secondary | ICD-10-CM

## 2024-08-08 ENCOUNTER — Ambulatory Visit: Payer: Self-pay | Admitting: Internal Medicine

## 2025-02-08 ENCOUNTER — Encounter: Payer: Self-pay | Admitting: Internal Medicine
